# Patient Record
Sex: Female | Born: 2005
Health system: Southern US, Community
[De-identification: ages and names within clinical notes are randomized; demographics above are authoritative.]

## PROBLEM LIST (undated history)

## (undated) ENCOUNTER — Inpatient Hospital Stay (HOSPITAL_COMMUNITY): Payer: Self-pay

## (undated) DIAGNOSIS — Q600 Renal agenesis, unilateral: Secondary | ICD-10-CM

## (undated) DIAGNOSIS — K2100 Gastro-esophageal reflux disease with esophagitis, without bleeding: Secondary | ICD-10-CM

## (undated) DIAGNOSIS — G43D0 Abdominal migraine, not intractable: Secondary | ICD-10-CM

## (undated) DIAGNOSIS — Z789 Other specified health status: Secondary | ICD-10-CM

## (undated) HISTORY — PX: ESOPHAGOGASTRODUODENOSCOPY ENDOSCOPY: SHX5814

---
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
## 2023-01-17 ENCOUNTER — Other Ambulatory Visit: Payer: Self-pay

## 2023-01-17 ENCOUNTER — Encounter (HOSPITAL_COMMUNITY): Payer: Self-pay | Admitting: Emergency Medicine

## 2023-01-17 ENCOUNTER — Emergency Department (HOSPITAL_COMMUNITY)
Admission: EM | Admit: 2023-01-17 | Discharge: 2023-01-18 | Disposition: A | Discharge status: Home / Self Care | Payer: 59 | Attending: Emergency Medicine | Admitting: Emergency Medicine

## 2023-01-17 DIAGNOSIS — R1084 Generalized abdominal pain: Secondary | ICD-10-CM | POA: Diagnosis present

## 2023-01-17 DIAGNOSIS — R112 Nausea with vomiting, unspecified: Secondary | ICD-10-CM | POA: Diagnosis not present

## 2023-01-17 DIAGNOSIS — R109 Unspecified abdominal pain: Secondary | ICD-10-CM

## 2023-01-17 LAB — CBC
HCT: 40.9 % (ref 36.0–46.0)
Hemoglobin: 13.1 g/dL (ref 12.0–15.0)
MCH: 27.7 pg (ref 26.0–34.0)
MCHC: 32 g/dL (ref 30.0–36.0)
MCV: 86.5 fL (ref 80.0–100.0)
Platelets: 425 10*3/uL — ABNORMAL HIGH (ref 150–400)
RBC: 4.73 MIL/uL (ref 3.87–5.11)
RDW: 14.2 % (ref 11.5–15.5)
WBC: 16.6 10*3/uL — ABNORMAL HIGH (ref 4.0–10.5)
nRBC: 0 % (ref 0.0–0.2)

## 2023-01-17 LAB — COMPREHENSIVE METABOLIC PANEL
ALT: 15 U/L (ref 0–44)
AST: 23 U/L (ref 15–41)
Albumin: 4.2 g/dL (ref 3.5–5.0)
Alkaline Phosphatase: 70 U/L (ref 38–126)
Anion gap: 11 (ref 5–15)
BUN: 6 mg/dL (ref 6–20)
CO2: 22 mmol/L (ref 22–32)
Calcium: 9.3 mg/dL (ref 8.9–10.3)
Chloride: 106 mmol/L (ref 98–111)
Creatinine, Ser: 0.85 mg/dL (ref 0.44–1.00)
GFR, Estimated: >60 mL/min (ref >60)
Glucose, Bld: 130 mg/dL — ABNORMAL HIGH (ref 70–99)
Potassium: 3.1 mmol/L — ABNORMAL LOW (ref 3.5–5.1)
Sodium: 139 mmol/L (ref 135–145)
Total Bilirubin: 0.5 mg/dL (ref 0.3–1.2)
Total Protein: 7.6 g/dL (ref 6.5–8.1)

## 2023-01-17 LAB — URINALYSIS, ROUTINE W REFLEX MICROSCOPIC
Bacteria, UA: NONE SEEN
Bilirubin Urine: NEGATIVE
Glucose, UA: NEGATIVE mg/dL
Ketones, ur: NEGATIVE mg/dL
Leukocytes,Ua: NEGATIVE
Nitrite: NEGATIVE
Protein, ur: 30 mg/dL — AB
Specific Gravity, Urine: 1.021 (ref 1.005–1.030)
pH: 5 (ref 5.0–8.0)

## 2023-01-17 LAB — LIPASE, BLOOD: Lipase: 26 U/L (ref 11–51)

## 2023-01-17 LAB — I-STAT BETA HCG BLOOD, ED (MC, WL, AP ONLY): I-stat hCG, quantitative: <5 m[IU]/mL (ref <5)

## 2023-01-17 MED ORDER — ONDANSETRON HCL 4 MG/2ML IJ SOLN
4.0000 mg | Freq: Once | INTRAMUSCULAR | Status: AC
  Administered 2023-01-17: 4 mg via INTRAVENOUS
  Filled 2023-01-17: qty 2

## 2023-01-17 NOTE — ED Triage Notes (Signed)
Patient reports she started her menstrual cycle today and started having severe abd pain along with n/v and bilateral leg weakness and pain.

## 2023-01-18 ENCOUNTER — Emergency Department (HOSPITAL_COMMUNITY): Payer: 59

## 2023-01-18 LAB — CK: Total CK: 118 U/L (ref 38–234)

## 2023-01-18 MED ORDER — ONDANSETRON HCL 4 MG/2ML IJ SOLN
4.0000 mg | Freq: Once | INTRAMUSCULAR | Status: AC
  Administered 2023-01-18: 4 mg via INTRAVENOUS
  Filled 2023-01-18: qty 2

## 2023-01-18 MED ORDER — GADOBUTROL 1 MMOL/ML IV SOLN
5.5000 mL | Freq: Once | INTRAVENOUS | Status: AC | PRN
  Administered 2023-01-18: 5.5 mL via INTRAVENOUS

## 2023-01-18 MED ORDER — LACTATED RINGERS IV BOLUS
1000.0000 mL | Freq: Once | INTRAVENOUS | Status: AC
  Administered 2023-01-18: 1000 mL via INTRAVENOUS

## 2023-01-18 MED ORDER — FENTANYL CITRATE PF 50 MCG/ML IJ SOSY
50.0000 ug | PREFILLED_SYRINGE | Freq: Once | INTRAMUSCULAR | Status: AC
  Administered 2023-01-18: 50 ug via INTRAVENOUS
  Filled 2023-01-18: qty 1

## 2023-01-18 MED ORDER — PROCHLORPERAZINE EDISYLATE 10 MG/2ML IJ SOLN
10.0000 mg | Freq: Once | INTRAMUSCULAR | Status: AC
  Administered 2023-01-18: 10 mg via INTRAVENOUS
  Filled 2023-01-18: qty 2

## 2023-01-18 MED ORDER — LORAZEPAM 1 MG PO TABS
1.0000 mg | ORAL_TABLET | Freq: Once | ORAL | Status: AC
  Administered 2023-01-18: 1 mg via ORAL
  Filled 2023-01-18: qty 1

## 2023-01-18 MED ORDER — POTASSIUM CHLORIDE CRYS ER 20 MEQ PO TBCR
40.0000 meq | EXTENDED_RELEASE_TABLET | Freq: Once | ORAL | Status: AC
  Administered 2023-01-18: 40 meq via ORAL
  Filled 2023-01-18: qty 2

## 2023-01-18 NOTE — ED Notes (Signed)
Patient was given K+ pills x 2 upon return from MRI and did well with them and water.

## 2023-01-18 NOTE — Discharge Instructions (Addendum)
Follow up with a neurologist for further evaluation of your leg numbness and weakness.  Your MRI today did not show any spinal cord problems.  Follow-up with your primary doctor regarding your menstrual cramps and abdominal pain.  Return to the ED with worsening pain, fever, vomiting, chest pain, shortness of breath or any concerns.

## 2023-01-18 NOTE — ED Provider Notes (Signed)
Julie Rubio   CSN: [id number] Arrival date & time: 01/17/23  1902     History  Chief Complaint  Patient presents with   Abdominal Pain   Extremity Weakness    Julie Rubio is a 17 y.o. female.  Patient with a history of abdominal migraines presenting with typical lower abdominal pain and nausea and vomiting that she gets with her menses.  Menses that onset today.  She has diffuse crampy lower abdominal pain with multiple episodes of nonbloody nonbilious emesis.  This is similar to previous abdominal migraines.  However today she developed "numbness and tingling" to her bilateral quadriceps from the thighs to the knees bilaterally.  Also feels weak in her legs.  States she was able to walk but is not able to.  Denies any trauma.  Denies any back pain or neck pain.  Denies any fevers, chills, chest pain or shortness of breath. No head trauma bowel or bladder incontinence.  No history of IV drug abuse or cancer.  No neck or back surgery.  Mother reports patient had similar episode in 2019 when she had numbness and tingling to her legs bilaterally was admitted to the hospital.  She had negative MRIs at that time and was thought she might have some postviral myalgia.  She was not given a clear diagnosis and did not follow-up.  She denies any chest pain, shortness of breath, pain with urination or blood in the urine.   No back pain or neck pain today.  Was not given a clear diagnosis when this episode happened in 2019.  Symptoms did improve and she had negative MRIs.  The history is provided by the patient and a relative.  Abdominal Pain Associated symptoms: nausea   Associated symptoms: no cough, no dysuria, no fever, no hematuria and no shortness of breath   Extremity Weakness Associated symptoms include abdominal pain. Pertinent negatives include no shortness of breath.       Home Medications Prior to Admission  medications   Medication Sig Start Date End Date Taking? Authorizing Provider  cetirizine HCl (ZYRTEC) 5 MG/5ML SYRP Take 7.5 mLs (7.5 mg total) by mouth daily. Patient not taking: Reported on 02/09/2018 09/30/13   Ree Shay, MD  cyclobenzaprine (FLEXERIL) 5 MG tablet Take 1 tablet (5 mg total) by mouth 3 (three) times daily as needed. 01/19/21   Viviano Simas, NP  cyproheptadine (PERIACTIN) 4 MG tablet Take 4 mg by mouth 2 (two) times daily. 12/05/17   [provider]  fluticasone (FLONASE) 50 MCG/ACT nasal spray Place 1 spray into both nostrils daily as needed for allergies. 12/14/17   [provider]  omeprazole (PRILOSEC) 20 MG capsule Take 20 mg by mouth daily. 01/10/18   [provider]      Allergies    Nsaids    Review of Systems   Review of Systems  Constitutional:  Negative for activity change, appetite change and fever.  HENT:  Negative for congestion and rhinorrhea.   Respiratory:  Negative for cough, chest tightness and shortness of breath.   Gastrointestinal:  Positive for abdominal pain and nausea.  Genitourinary:  Negative for dysuria and hematuria.  Musculoskeletal:  Positive for extremity weakness. Negative for arthralgias and myalgias.  Skin:  Negative for rash.  Neurological:  Positive for numbness. Negative for weakness.   all other systems are negative except as noted in the HPI and PMH.    Physical Exam Updated Vital Signs  BP 119/75   Pulse (!) 101   Temp 98 F (36.7 C)   Resp 14   Ht 5\' 4"  (1.626 m)   Wt 54.9 kg   LMP 01/17/2023 (Exact Date)   SpO2 100%   BMI 20.77 kg/m  Physical Exam Vitals and nursing Rubio reviewed.  Constitutional:      General: She is not in acute distress.    Appearance: She is well-developed.  HENT:     Head: Normocephalic and atraumatic.     Mouth/Throat:     Pharynx: No oropharyngeal exudate.  Eyes:     Conjunctiva/sclera: Conjunctivae normal.     Pupils: Pupils are equal, round, and reactive to  light.  Neck:     Comments: No meningismus. Cardiovascular:     Rate and Rhythm: Normal rate and regular rhythm.     Heart sounds: Normal heart sounds. No murmur heard. Pulmonary:     Effort: Pulmonary effort is normal. No respiratory distress.     Breath sounds: Normal breath sounds.  Abdominal:     Palpations: Abdomen is soft.     Tenderness: There is abdominal tenderness. There is no guarding or rebound.     Comments: Diffuse abdominal tenderness, no guarding or rebound  Musculoskeletal:        General: No tenderness. Normal range of motion.     Cervical back: Normal range of motion and neck supple.     Comments: 5/5 strength in bilateral lower extremities. Ankle plantar and dorsiflexion intact. Great toe extension intact bilaterally. +2 DP and PT pulses. +2 patellar reflexes bilaterally. Normal gait.  Subjective paresthesias to bilateral anterior thighs.  Skin:    General: Skin is warm.  Neurological:     Mental Status: She is alert and oriented to person, place, and time.     Cranial Nerves: No cranial nerve deficit.     Motor: No abnormal muscle tone.     Coordination: Coordination normal.     Comments: No ataxia on finger to nose bilaterally. No pronator drift. 5/5 strength throughout. CN 2-12 intact.Equal grip strength. Sensation intact.   Psychiatric:        Behavior: Behavior normal.     ED Results / Procedures / Treatments   Labs (all labs ordered are listed, but only abnormal results are displayed) Labs Reviewed  COMPREHENSIVE METABOLIC PANEL - Abnormal; Notable for the following components:      Result Value   Potassium 3.1 (*)    Glucose, Bld 130 (*)    All other components within normal limits  CBC - Abnormal; Notable for the following components:   WBC 16.6 (*)    Platelets 425 (*)    All other components within normal limits  URINALYSIS, ROUTINE W REFLEX MICROSCOPIC - Abnormal; Notable for the following components:   Hgb urine dipstick MODERATE (*)     Protein, ur 30 (*)    All other components within normal limits  LIPASE, BLOOD  CK  I-STAT BETA HCG BLOOD, ED ([facility], [facility], [facility] ONLY)    EKG EKG Interpretation  Date/Time:  Wednesday Jan 18 2023 04:53:05 EDT Ventricular Rate:  97 PR Interval:  117 QRS Duration: 88 QT Interval:  343 QTC Calculation: 436 R Axis:   88 Text Interpretation: Sinus rhythm Borderline short PR interval No previous ECGs available Confirmed by Glynn Octave [phone]) on 01/18/2023 4:58:12 AM  Radiology MR Lumbar Spine W Wo Contrast  Result Date: 01/18/2023 CLINICAL DATA:  Initial evaluation for mid back pain with bilateral  leg weakness and pain. EXAM: MRI THORACIC AND LUMBAR SPINE WITHOUT AND WITH CONTRAST TECHNIQUE: Multiplanar and multiecho pulse sequences of the thoracic and lumbar spine were obtained without and with intravenous contrast. CONTRAST:  5.69mL GADAVIST GADOBUTROL 1 MMOL/ML IV SOLN COMPARISON:  Prior study from 02/10/2018. FINDINGS: MRI THORACIC SPINE FINDINGS Alignment: Vertebral bodies normally aligned with preservation of the normal thoracic kyphosis. No listhesis. Vertebrae: Vertebral body height maintained without acute or chronic fracture. Bone marrow signal intensity within normal limits. No discrete or worrisome osseous lesions. No abnormal marrow edema or enhancement. Cord:  Normal signal and morphology.  No abnormal enhancement. Paraspinal and other soft tissues: Paraspinous soft tissues within normal limits. Congenital absence of the right kidney with compensatory hypertrophy of the left kidney noted. Disc levels: No significant disc pathology seen within the thoracic spine. Intervertebral discs are well hydrated with preserved disc height. No disc bulge or focal disc herniation. No significant facet disease. No canal or neural foraminal stenosis or evidence for neural impingement. MRI LUMBAR SPINE FINDINGS Segmentation: Standard. Lowest well-formed disc space labeled the L5-S1 level. Alignment: Trace  dextroscoliosis. Alignment otherwise normal preservation of the normal lumbar lordosis. No listhesis. Vertebrae: Vertebral body height maintained without acute or chronic fracture. Bone marrow signal intensity within normal limits. No discrete or worrisome osseous lesions. No abnormal marrow edema or enhancement. Conus medullaris: Extends to the T12-L1 level and appears normal. No abnormal enhancement. Paraspinal and other soft tissues: Paraspinous soft tissues within normal limits. Congenital absence of the right kidney with compensatory hypertrophy of the left kidney noted. Disc levels: No significant disc pathology seen within the lumbar spine. Intervertebral discs are well hydrated with preserved disc height. No disc bulge or focal disc herniation. No significant facet disease. No canal or neural foraminal stenosis or evidence for neural impingement. IMPRESSION: 1. Normal MRI of the thoracic and lumbar spine and spinal cord. No acute abnormality. No significant disc pathology, stenosis, or evidence for neural impingement. 2. Congenital absence of the right kidney with compensatory hypertrophy of the left kidney. Electronically Signed   By: Rise Mu M.D.   On: 01/18/2023 03:16   MR THORACIC SPINE W WO CONTRAST  Result Date: 01/18/2023 CLINICAL DATA:  Initial evaluation for mid back pain with bilateral leg weakness and pain. EXAM: MRI THORACIC AND LUMBAR SPINE WITHOUT AND WITH CONTRAST TECHNIQUE: Multiplanar and multiecho pulse sequences of the thoracic and lumbar spine were obtained without and with intravenous contrast. CONTRAST:  5.58mL GADAVIST GADOBUTROL 1 MMOL/ML IV SOLN COMPARISON:  Prior study from 02/10/2018. FINDINGS: MRI THORACIC SPINE FINDINGS Alignment: Vertebral bodies normally aligned with preservation of the normal thoracic kyphosis. No listhesis. Vertebrae: Vertebral body height maintained without acute or chronic fracture. Bone marrow signal intensity within normal limits. No  discrete or worrisome osseous lesions. No abnormal marrow edema or enhancement. Cord:  Normal signal and morphology.  No abnormal enhancement. Paraspinal and other soft tissues: Paraspinous soft tissues within normal limits. Congenital absence of the right kidney with compensatory hypertrophy of the left kidney noted. Disc levels: No significant disc pathology seen within the thoracic spine. Intervertebral discs are well hydrated with preserved disc height. No disc bulge or focal disc herniation. No significant facet disease. No canal or neural foraminal stenosis or evidence for neural impingement. MRI LUMBAR SPINE FINDINGS Segmentation: Standard. Lowest well-formed disc space labeled the L5-S1 level. Alignment: Trace dextroscoliosis. Alignment otherwise normal preservation of the normal lumbar lordosis. No listhesis. Vertebrae: Vertebral body height maintained without acute or chronic fracture.  Bone marrow signal intensity within normal limits. No discrete or worrisome osseous lesions. No abnormal marrow edema or enhancement. Conus medullaris: Extends to the T12-L1 level and appears normal. No abnormal enhancement. Paraspinal and other soft tissues: Paraspinous soft tissues within normal limits. Congenital absence of the right kidney with compensatory hypertrophy of the left kidney noted. Disc levels: No significant disc pathology seen within the lumbar spine. Intervertebral discs are well hydrated with preserved disc height. No disc bulge or focal disc herniation. No significant facet disease. No canal or neural foraminal stenosis or evidence for neural impingement. IMPRESSION: 1. Normal MRI of the thoracic and lumbar spine and spinal cord. No acute abnormality. No significant disc pathology, stenosis, or evidence for neural impingement. 2. Congenital absence of the right kidney with compensatory hypertrophy of the left kidney. Electronically Signed   By: Rise Mu M.D.   On: 01/18/2023 03:16   CT  ABDOMEN PELVIS WO CONTRAST  Result Date: 01/18/2023 CLINICAL DATA:  Severe abdominal pain, reportedly nonlocalized. Also with bilateral leg weakness. Epic notes indicate congenital absence of right kidney. EXAM: CT ABDOMEN AND PELVIS WITHOUT CONTRAST TECHNIQUE: Multidetector CT imaging of the abdomen and pelvis was performed following the standard protocol without IV contrast. RADIATION DOSE REDUCTION: This exam was performed according to the departmental dose-optimization program which includes automated exposure control, adjustment of the mA and/or kV according to patient size and/or use of iterative reconstruction technique. COMPARISON:  None Available. FINDINGS: Lower chest: No abnormality. Hepatobiliary: The unenhanced liver is homogeneous. No focal abnormality is seen without contrast. The gallbladder bile ducts are unremarkable. Pancreas: Unremarkable without contrast. Spleen: Unremarkable without contrast. No splenomegaly. There is a small splenule along the inferolateral edge of the spleen. Adrenals/Urinary Tract: Congenitally absent right kidney with compensatorily prominent left kidney. There is no adrenal mass. No contour deforming renal cortical abnormality on the left. There are occasional punctate nonobstructive caliceal stones in the collecting system. There is no hydroureteronephrosis or ureteral stone. The bladder is unremarkable allowing for the degree of distention. Stomach/Bowel: The unopacified stomach and small bowel are unremarkable. The appendix is segmentally visible over portions, with portions obscured due to overlapping structures. The visualized portion of the appendix is normal in caliber. There is mild wall thickening versus underdistention in the ascending, transverse and descending colon. Findings consistent with nondistention versus colitis. The rectosigmoid segment is stool-filled and otherwise unremarkable. Vascular/Lymphatic: No significant vascular findings are present. No  enlarged abdominal or pelvic lymph nodes. Reproductive: Uterus and bilateral adnexa are unremarkable. Other: No abdominal wall hernia or abnormality. No abdominopelvic ascites. Musculoskeletal: Slight lumbar dextroscoliosis. No acute or significant osseous findings. IMPRESSION: 1. Congenitally absent right kidney with compensatorily prominent left kidney. 2. Nonobstructive left micronephrolithiasis. 3. Mild wall thickening versus underdistention in the ascending, transverse and descending colon. Findings consistent with nondistention versus colitis. 4. Slight lumbar dextroscoliosis. Electronically Signed   By: Almira Bar M.D.   On: 01/18/2023 01:50    Procedures Procedures    Medications Ordered in ED Medications  prochlorperazine (COMPAZINE) injection 10 mg (has no administration in time range)  ondansetron (ZOFRAN) injection 4 mg (4 mg Intravenous Given 01/17/23 1949)  lactated ringers bolus 1,000 mL (1,000 mLs Intravenous New Bag/Given 01/18/23 0042)  ondansetron (ZOFRAN) injection 4 mg (4 mg Intravenous Given 01/18/23 0043)  fentaNYL (SUBLIMAZE) injection 50 mcg (50 mcg Intravenous Given 01/18/23 0043)    ED Course/ Medical Decision Making/ A&P  Medical Decision Making Amount and/or Complexity of Data Reviewed Labs: ordered. Decision-making details documented in ED Course. Radiology: ordered and independent interpretation performed. Decision-making details documented in ED Course. ECG/medicine tests: ordered and independent interpretation performed. Decision-making details documented in ED Course.  Risk Prescription drug management.   Abdominal pain with nausea and vomiting the setting of menses similar to previous abdominal migraines.  Vitals are stable, no distress.  Abdomen is tender but not peritoneal. Does have pain and "numbness" to bilateral thighs which is unusual for her.   Discussed with Dr. Otelia Limes of neurology.  He does not favor spinal cord  pathology.  Recommends treatment for abdominal migraine with fluids and antiemetics.  She has intact distal strength, sensation, pulses and reflexes.  Low suspicion for Gullian Barre syndrome or transverse myelitis.  HCG is negative.  Urinalysis is negative.  Potassium slightly low at 3.1, this is replaced.  Leukocytosis similar to previous. CK is normal. Abdomen soft without peritoneal signs.''  Patient treated empirically for suspected abdominal migraine causing her cramps and numbness to her anterior thighs.  Low suspicion for acute spinal cord pathology.  Low suspicion for Gullian Barre syndrome or transverse myelitis.  MRI shows no acute spinal cord pathology.  Results reviewed interpreted by me.  Abdomen is soft on recheck without peritoneal signs.  Low suspicion of acute surgical pathology.  Patient sleepy after receiving ativan for MRI. But she is able to tolerate PO and ambulate without assistance.   Suspect her numbness and pain to legs was secondary to her abdominal pain and possible abdominal migraine.  No evidence of acute neurological emergency.   Referral to neurology given.  Patient feels improved, tolerating PO and ambulatory.  Results d/w patient and mother at bedside who agree with outpatient followup.  Return precautions discussed.         Final Clinical Impression(s) / ED Diagnoses Final diagnoses:  Abdominal pain, unspecified abdominal location    Rx / DC Orders ED Discharge Orders     None         Inez Stantz, Jeannett Senior, MD 01/18/23 [id number]

## 2023-01-19 ENCOUNTER — Encounter: Payer: Self-pay | Admitting: Neurology

## 2023-01-31 ENCOUNTER — Ambulatory Visit: Payer: 59 | Admitting: Neurology

## 2023-01-31 ENCOUNTER — Encounter: Payer: Self-pay | Admitting: Neurology

## 2023-01-31 VITALS — BP 120/71 | HR 88 | Ht 64.0 in | Wt 120.0 lb

## 2023-01-31 DIAGNOSIS — R531 Weakness: Secondary | ICD-10-CM | POA: Diagnosis not present

## 2023-01-31 DIAGNOSIS — R202 Paresthesia of skin: Secondary | ICD-10-CM | POA: Diagnosis not present

## 2023-01-31 NOTE — Patient Instructions (Signed)
If your symptoms return, please contact my office for nerve and muscle testing

## 2023-01-31 NOTE — Progress Notes (Signed)
[facility] Clinic Note - Initial Visit   Date: 01/31/2023   Julie Rubio MRN: [id number] DOB: 12/23/2005   Dear Dr. Manus Gunning:  Thank you for your kind referral of Julie Rubio for consultation of bilateral leg weakness. Although her history is well known to you, please allow Julie Rubio to reiterate it for the purpose of our medical record. The patient was accompanied to the clinic by self.     Shalini Tumeka Aseltine is a 17 y.o. right-handed female with abdominal migraines presenting for evaluation of bilateral leg weakness.   IMPRESSION/PLAN: Paresthesias and subjective weakness.  Neurological exam is entirely normal today.  MRI thoracic and lumbar spine was personally viewed and does not show any abnormalities.  Her potassium was mildly low when she presented to the ER, however potassium when she has similar presentation in 2019 was normal, therefore hypokalemic periodic paralysis is unlikely.  NCS/EMG of the legs was offered, she declined additional testing and will monitor symptoms.  Given lack of organic etiology and diffuse paralysis which self-resolved, there was concern whether symptoms were related to migraine.  Also, conversion disorder cannot be excluded.   Return to clinic as needed  ------------------------------------------------------------- History of present illness: She presented to the ER on 5/14 after having sudden onset of bilateral leg numbness and weakness.  She reports that weakness was so profound that she was unable to support herself to walk and her uncle carried into the car to go to the ER.  ER notes indicate she was also having abdominal pain and nausea similar to what she has experienced with abdominal migraines. She had MRI thoracic and lumbar spine which was normal. ER notes indicate that she was able to ambulate without assistance and discharged home, however patient tells me that she was unable to walk until the following day.  She  reports having a similar spell in 2019 which was thought to be associated with viral illness (MRI wwo contrast cervical, thoracic, and lumbar spine was normal).  Symptoms also self-resolved.   Out-side paper records, electronic medical record, and images have been reviewed where available and summarized as:  MRI thoracic and lumbar spine wwo contrast 01/18/2023: 1. Normal MRI of the thoracic and lumbar spine and spinal cord. No acute abnormality. No significant disc pathology, stenosis, or evidence for neural impingement. 2. Congenital absence of the right kidney with compensatory hypertrophy of the left kidney.  Lab Results  Component Value Date   ESRSEDRATE 10 02/09/2018   Lab Results  Component Value Date   CREATININE 0.85 01/17/2023   BUN 6 01/17/2023   NA 139 01/17/2023   K 3.1 (L) 01/17/2023   CL 106 01/17/2023   CO2 22 01/17/2023     Past Medical History:  Diagnosis Date   Abdominal migraine    Medical history non-contributory    Reflux esophagitis     Past Surgical History:  Procedure Laterality Date   ESOPHAGOGASTRODUODENOSCOPY ENDOSCOPY       Medications:  Outpatient Encounter Medications as of 01/31/2023  Medication Sig   cyclobenzaprine (FLEXERIL) 5 MG tablet Take 1 tablet (5 mg total) by mouth 3 (three) times daily as needed.   cyproheptadine (PERIACTIN) 4 MG tablet Take 4 mg by mouth 2 (two) times daily.   dicyclomine (BENTYL) 20 MG tablet Take 20 mg by mouth 3 (three) times daily.   fluticasone (FLONASE) 50 MCG/ACT nasal spray Place 1 spray into both nostrils daily as needed for allergies.   ondansetron (ZOFRAN) 4 MG  tablet 1 dissolving tablet Orally three times daily   cetirizine HCl (ZYRTEC) 5 MG/5ML SYRP Take 7.5 mLs (7.5 mg total) by mouth daily. (Patient not taking: Reported on 02/09/2018)   omeprazole (PRILOSEC) 20 MG capsule Take 20 mg by mouth daily. (Patient not taking: Reported on 01/31/2023)   No facility-administered encounter medications on file  as of 01/31/2023.    Allergies:  Allergies  Allergen Reactions   Nsaids     Pt has one kidney    Family History: Family History  Problem Relation Age of Onset   Raynaud syndrome Mother     Social History: Social History   Tobacco Use   Smoking status: Never   Smokeless tobacco: Never  Vaping Use   Vaping Use: Never used  Substance Use Topics   Alcohol use: Never   Drug use: Never   Social History   Social History Narrative   Lives with her mother at home, no pets, no smoking.      Right Handed    Lives in a one story home      College Student- Dance    Vital Signs:  BP 120/71   Pulse 88   Ht 5\' 4"  (1.626 m)   Wt 120 lb (54.4 kg)   LMP 01/17/2023 (Exact Date)   BMI 20.60 kg/m    Neurological Exam: MENTAL STATUS including orientation to time, place, person, recent and remote memory, attention span and concentration, language, and fund of knowledge is normal.  Speech is not dysarthric.  CRANIAL NERVES: II:  No visual field defects.     III-IV-VI: Pupils equal round and reactive to light.  Normal conjugate, extra-ocular eye movements in all directions of gaze.  No nystagmus.  No ptosis.   V:  Normal facial sensation.    VII:  Normal facial symmetry and movements.   VIII:  Normal hearing and vestibular function.   IX-X:  Normal palatal movement.   XI:  Normal shoulder shrug and head rotation.   XII:  Normal tongue strength and range of motion, no deviation or fasciculation.  MOTOR:  No atrophy, fasciculations or abnormal movements.  No pronator drift.   Upper Extremity:  Right  Left  Deltoid  5/5   5/5   Biceps  5/5   5/5   Triceps  5/5   5/5   Wrist extensors  5/5   5/5   Wrist flexors  5/5   5/5   Finger extensors  5/5   5/5   Finger flexors  5/5   5/5   Dorsal interossei  5/5   5/5   Abductor pollicis  5/5   5/5   Tone (Ashworth scale)  0  0   Lower Extremity:  Right  Left  Hip flexors  5/5   5/5   Knee flexors  5/5   5/5   Knee extensors   5/5   5/5   Dorsiflexors  5/5   5/5   Plantarflexors  5/5   5/5   Toe extensors  5/5   5/5   Toe flexors  5/5   5/5   Tone (Ashworth scale)  0  0   MSRs:                                           Right        Left brachioradialis 2+  2+  biceps 2+  2+  triceps 2+  2+  patellar 2+  2+  ankle jerk 2+  2+  Hoffman no  no  plantar response down  down   SENSORY:  Normal and symmetric perception of light touch, pinprick, vibration, and temperature.    COORDINATION/GAIT: Normal finger-to- nose-finger.  Intact rapid alternating movements bilaterally.  Able to rise from a chair without using arms.  Gait narrow based and stable. Tandem and stressed gait intact.    Thank you for allowing me to participate in patient's care.  If I can answer any additional questions, I would be pleased to do so.    Sincerely,    Bingham Millette K. Allena Katz, DO

## 2023-02-07 ENCOUNTER — Encounter: Payer: Self-pay | Admitting: Psychology

## 2023-02-07 ENCOUNTER — Encounter: Payer: 59 | Attending: Psychology | Admitting: Psychology

## 2023-02-07 DIAGNOSIS — F410 Panic disorder [episodic paroxysmal anxiety] without agoraphobia: Secondary | ICD-10-CM

## 2023-02-07 DIAGNOSIS — F411 Generalized anxiety disorder: Secondary | ICD-10-CM | POA: Insufficient documentation

## 2023-02-07 DIAGNOSIS — F331 Major depressive disorder, recurrent, moderate: Secondary | ICD-10-CM | POA: Diagnosis present

## 2023-02-07 DIAGNOSIS — G43D0 Abdominal migraine, not intractable: Secondary | ICD-10-CM | POA: Diagnosis present

## 2023-02-07 DIAGNOSIS — F5102 Adjustment insomnia: Secondary | ICD-10-CM | POA: Diagnosis present

## 2023-02-07 NOTE — Progress Notes (Signed)
Neuropsychological Consultation   Patient:   Julie Rubio   DOB:   02/21/2006  MR Number:  [id number]  Location:  [facility] MEDICINE [facility] PHYSICAL MEDICINE & REHABILITATION [address] [place], [address] [phone] [facility] [place] [address] Dept: [phone]           Date of Service:   02/07/2023  Location of Service and Individuals present: Today's visit was conducted in my outpatient clinic office with the patient present.  Start Time:   9 AM End Time:   10:30 AM  Patient Consent and Confidentiality: Review of limits of confidentiality including the fact that I had previously seen her mother for unrelated issue and both the patient and her mother consent to the patient seeing me and understand some of the vulnerabilities but they are quite open with each other regarding what we will be addressing.  The patient is an adult and will be provided with a confidential interaction and mother is understanding of restriction of information being provided to her regarding the patient and her care.  Consent for Evaluation and Treatment:  Signed:  Yes Explanation of Privacy Policies:  Signed:  Yes Discussion of Confidentiality Limits:  Yes  Provider/Observer:  Arley Phenix, Psy.D.       Clinical Neuropsychologist       Billing Code/Service: [phone]  Chief Complaint:     Chief Complaint  Patient presents with   Anxiety   Depression   Migraine   Other    Abdominal migraine   Panic Attack   Sleeping Problem    Reason for Service:    Julie Rubio is a 17 year old female referred by her primary care provider for neuropsychological/psychological therapeutic interventions.  Patient has also been followed by Nita Sickle, DO with [facility] neurology due to past paresthesias with subjective leg weakness, history of migraine and more frequently abdominal migraine.  During the May 2024 event the patient was unable to stand or walk and  experienced significant abdominal pain and severe cramps.  Other medical issues include the patient having a solitary kidney without kidney disease and that kidney, episodes of anemia.  Patient has a history of depressive symptomatology going back to around the eighth grade and anxiety starting in high school.  The patient has had episodes of severe depression with suicidal ideation previously and currently denies any suicidal ideation with improvement in depression but continued psychosocial stressors that can exacerbate anxiety and depressive symptomatology.  The patient's most recent acute event where she developed numbness and tingling in bilateral quadriceps from the thighs to the knees and inability to walk resulted in emergency department presentation on 01/17/2023.  Patient has had a past history of abdominal migraines including abdominal pain and nausea and vomiting that typically correlate with the onset of her menses.  She did return to baseline within 24 hours and workup did not indicate acute medical issues such as Katheran Awe, stenosis or abnormalities in her spinal column/spinal cord and no clear diagnosis beyond the potential for it to be related to her abdominal migraines and blood flow changes.  The patient has not had another episode of this since May of this year.  Patient reports that she developed significant depression in middle school after her father, who was separated from her mother, went back to school for a professional truck driving program.  She did not see him much and they grew distance during this time.  Then the person that the patient had  medical illness in 2019 that resulted in hospitalization and some complications associated with antibiotic interventions complicated relationship she would been in for 3 years and the young man ultimately cheated on her resulting in a break-up of this relationship.  This was in the eighth/just prior to ninth grade.  The patient then started  high school and was relatively apprehensive about making new relationships after the distance/loss from her father and experience with her now ex-boyfriend.  The patient reports that then the COVID pandemic developed and there was a lot of stress and isolation without going to school and doing things online.  The patient reports that her depression became more significant during that time and she had difficulty doing her schoolwork and was overwhelmed with the isolation.  Patient reports that she was even surprised she was able to graduate as there were significant epochs of time during COVID where she was doing little of her schoolwork.  She developed significant depression with suicidal ideation during that time.  With the patient's father returned to this area and reconnected and had a positive time with their relationship.  The patient was active in school and track after the ending of pandemic restrictions.  The patient's father and had significant health issues.  The patient went on the track meet where she likely contacted COVID and when she returned she was fearful that she would give her father COVID and when he was not doing well medically she avoided going to see him.  During this time her father had medical crisis and ultimately died.  The funeral was very difficult for the patient with her father having 2 younger children from another mother and the fathers family essentially distancing themselves from her after the father passed away.  The patient reports that she still has recurrent dreams about him and wakes up feeling like he is there and will be in a panic when she wakes up.  The patient describes other anxiety/panic events generalized anxiety at other times.  The patient is not taking any SSRI or other psychotropic medications.  The patient has significant sleep disturbance and currently primarily sleeps during the day and is awake at night.  Patient has difficulty falling asleep and staying  asleep and this has been a chronic long-term issue.  Patient has had vivid dreams that are nightmare in nature and waking up in distress state.  The patient describes a irregular appetite and inconsistent eating pattern although she does eat high-quality foods the majority of the time when she does eat.  The patient gets regular exercise and was on a track scholarship when she started school at [employer] and while she does not plan on pursuing track now that she is transferring to [employer] she does plan on majoring in dance and continuing with her regular physical activity.  The patient had a very stressful experience starting college at [employer].  She had a very challenging and troubled roommate assigned to her initially.  Patient also had gone to [employer] on a track scholarship and after agreeing the coach was changed and there was a lot of complications around track with failures of documentation by the track coach around eligibility not only with the patient but open the other track participants.  The roommate was very stressful and the administrative end of the school did not handle the situation particularly well adding to the patient's stress.  She is not returning back to [employer] primarily because of challenges  with how they provided housing and their response to many factors related to campus life for the patient.  Medical History:   Past Medical History:  Diagnosis Date   Abdominal migraine    Medical history non-contributory    Reflux esophagitis          Patient Active Problem List   Diagnosis Date Noted   Fever, unspecified    Kidney congenitally absent, right 02/10/2018   Bilateral leg pain 02/09/2018   Leg weakness, bilateral 02/09/2018   Abdominal migraine 12/11/2017   Gastroesophageal reflux disease with esophagitis 12/11/2017    Additional Tests and Measures from other records:  Neuroimaging Results: Patient has  had 2 episodes of acute leg weakness to the point of inability to ambulate.  During the 2019 and 2024 events she had MR thoracic, cervical spine conducted as well as CT abdomen during the 2024 event.  There have been no abnormalities noted on these MRIs to explain her acute onset of leg weakness and inability to ambulate.  Laboratory Tests: Patient has had episodes of elevated white blood cell count which was one of the reasons she was treated with antibiotics in 2019.  She has had anemia at times but not severe and low hemoglobin in the past.  She has also had elevated platelet levels but not severe.  Patient was mildly low in potassium with her recent hospitalization/ED visit.  Current Typical Mood State:  Depressed and Anxious  Sleep: Patient has a very poor sleep hygiene pattern and difficulty falling asleep and staying asleep.  Behavioral Observation/Mental Status:   Kazlynn Hibberd  presents as a 17 y.o.-year-old Right handed African American Female who appeared her stated age. her dress was Appropriate and she was Well Groomed and her manners were Appropriate to the situation.  her participation was indicative of Appropriate and Attentive behaviors.  There were not physical disabilities noted.  she displayed an appropriate level of cooperation and motivation.    Interactions:    Active Appropriate and Attentive  Attention:   within normal limits and attention span and concentration were age appropriate  Memory:   within normal limits; recent and remote memory intact  Visuo-spatial:   not examined  Speech (Volume):  low  Speech:   normal; normal  Thought Process:  Coherent and Relevant  Coherent, Directed, Logical, and Organized  Though Content:  WNL; not suicidal and not homicidal  Orientation:   person, place, time/date, and situation  Judgment:   Good  Planning:   Fair  Affect:    Anxious  Mood:    Dysphoric  Insight:   Good  Intelligence:   high  Marital  Status/Living:  Patient was born and raised in [place] and has 2 younger half siblings.  She lives with her mother and is her mother's only child.  Her parents are divorced and her father is now deceased.  The patient is lived with her mother her entire life with the exception of time when she was living in the dorms at [employer].  The patient is not married and has no children.  She is transferring to [employer] in [place].  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  Patient has had active participation in extracurricular activities and has participated in dance, track and field and the cheer team at [employer].  Patient does not expect  to be part of the track team at [employer].  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    Presenter, broadcasting and Interests: Working out/exercise, Psychologist, educational and cooking  Impact of Symptoms on Work or School:  Patient has had significant psychosocial challenges and struggles that if impacted her mood status and her capacity for work and school.  Impact of Symptoms on Social Functioning and Interpersonal Relationships: Patient tends to struggle with developing and maintaining friends and relationships and is apprehensive about getting connected to someone for fear of loss.   Psychiatric History:    Abuse/Trauma History: Patient's father died in the recent past due to significant medical complications.  Previous Diagnoses: Patient has been diagnosed with recurrent major depression and generalized anxiety with panic events.  Patient had significant episode of severe depression with suicidal ideation in 2019/2020.  History of Substance Use or Abuse:  No concerns of substance abuse are reported.    Family  Med/Psych History:  Family History  Problem Relation Age of Onset   Raynaud syndrome Mother     Risk of Suicide/Violence: virtually non-existent patient denies any suicidal or homicidal ideation.  Impression/DX:   Aiyahna Farquhar is a 17 year old female referred by her primary care provider for neuropsychological/psychological therapeutic interventions.  Patient has also been followed by Nita Sickle, DO with [facility] neurology due to past paresthesias with subjective leg weakness, history of migraine and more frequently abdominal migraine.  During the May 2024 event the patient was unable to stand or walk and experienced significant abdominal pain and severe cramps.  Other medical issues include the patient having a solitary kidney without kidney disease and that kidney, episodes of anemia.  Patient has a history of depressive symptomatology going back to around the eighth grade and anxiety starting in high school.  The patient has had episodes of severe depression with suicidal ideation previously and currently denies any suicidal ideation with improvement in depression but continued psychosocial stressors that can exacerbate anxiety and depressive symptomatology.  Disposition/Plan:  We have set the patient up for psychotherapeutic interventions around depression and generalized anxiety disorder and also looking at significant sleep disorder/insomnia and addressing issues related to abdominal migraine/migraine.  Initially we have scheduled for appointments approximately 2 weeks apart to work on foundational aspects of adjustment and addressing issues that are contributing to her recurrent depression and anxiety and sleep disturbance as well as her abdominal migraine history.  Because of scheduling difficulties they were not initially scheduled until April and May 2025 as those were the first available in my schedule but the patient has been placed on the wait list.  We do expect appointments to open up  in late summer early fall follow-up and have sooner appointments with the patient.  Diagnosis:    Major depressive disorder, recurrent episode, moderate (HCC)  Generalized anxiety disorder with panic attacks  Abdominal migraine, not intractable  Adjustment insomnia        Note: This document was prepared using Dragon voice recognition software and may include unintentional dictation errors.   Electronically Signed   _______________________ Arley Phenix, Psy.D. Clinical Neuropsychologist

## 2023-02-21 ENCOUNTER — Ambulatory Visit: Payer: 59 | Admitting: Neurology

## 2023-09-05 IMAGING — MR MR SHOULDER*L* W/O CM
8 series · 40 of 40 positions shown · non-contrast
Comparison: X-ray shoulder 01/19/2021.

CLINICAL DATA: Rotator cuff syndrome of left shoulder. Left
shoulder pain with decreased range of motion for 1 year related to
an injury, 01/19/2021.

EXAM:
MRI OF THE LEFT SHOULDER WITHOUT CONTRAST
TECHNIQUE: Multiplanar, multisequence MR imaging of the shoulder was performed.
No intravenous contrast was administered.

[Series 3: T2 fat-sat · axial · 4.0mm · 0.27mm/px · z∈[-98,+25]mm · 6 of 26 slices shown (1 of 4)]
[im 1/26]
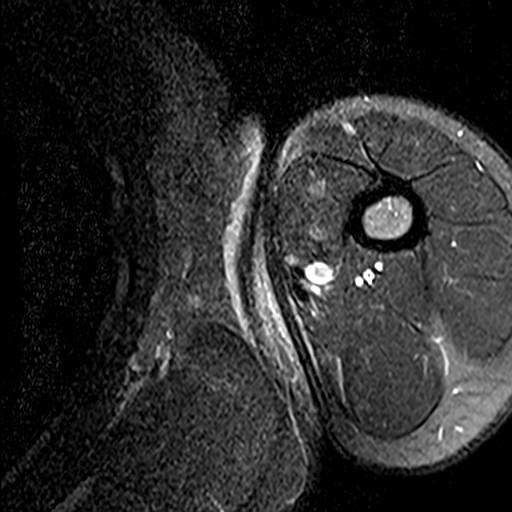
[im 6/26]
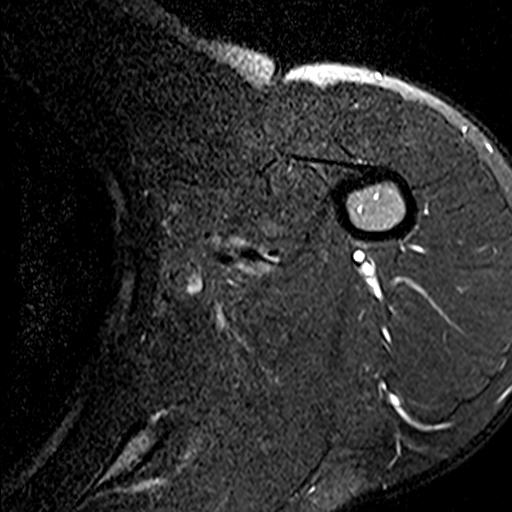
[im 11/26]
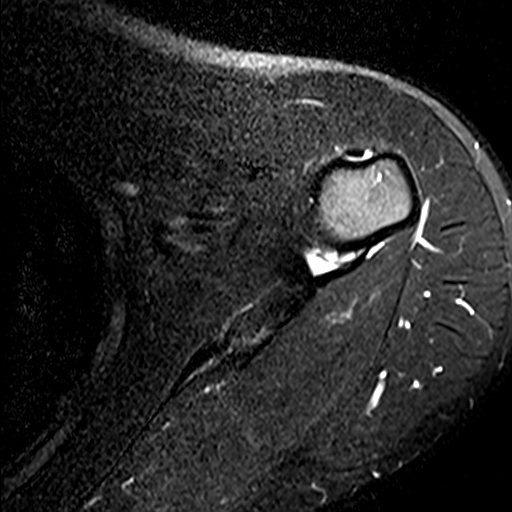
[im 16/26]
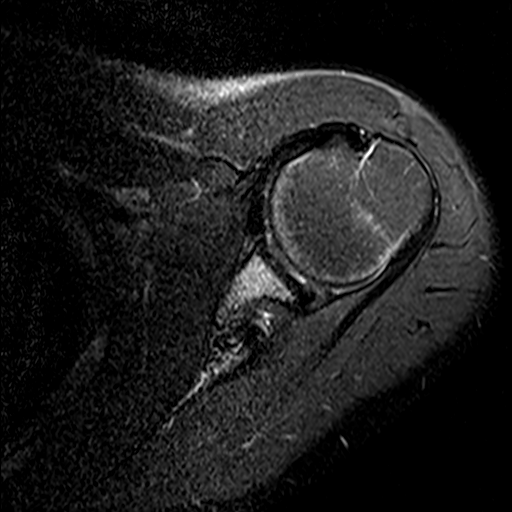
[im 21/26]
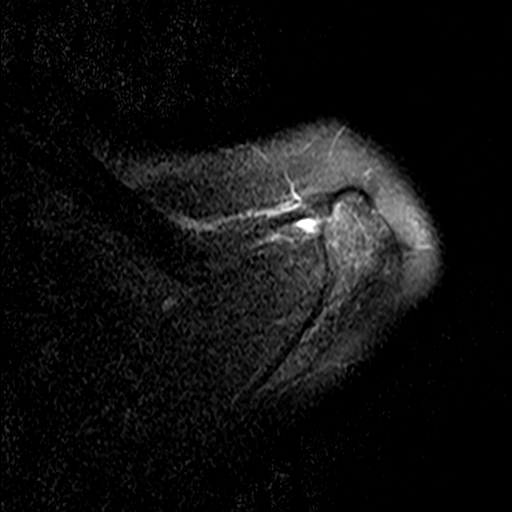
[im 26/26]
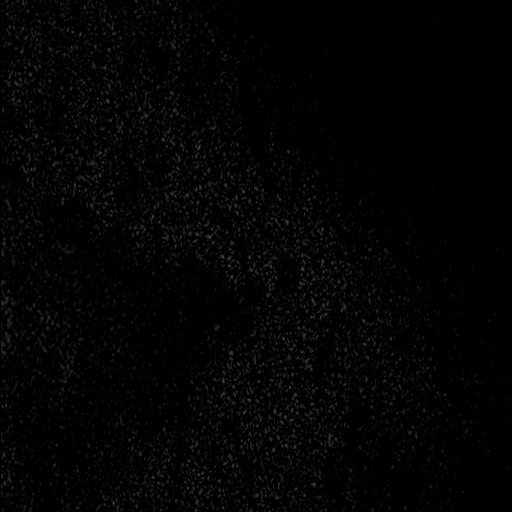

[Series 4: T2 fat-sat · oblique · 4.0mm · 0.59mm/px · 4 of 18 slices shown (2 of 4)]
[im 1/18]
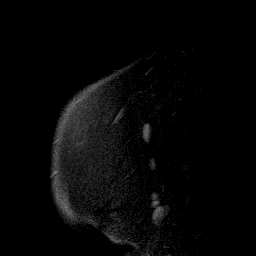
[im 6/18]
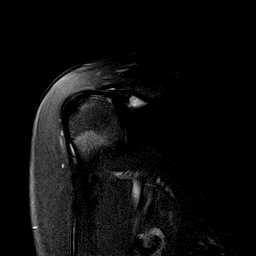
[im 12/18]
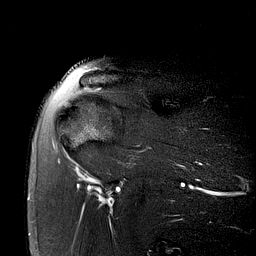
[im 18/18]
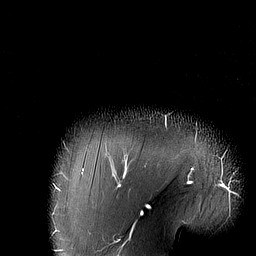

[Series 5: PD · oblique · 4.0mm · 0.59mm/px · 5 of 18 slices shown (1 of 2)]
[im 1/18]
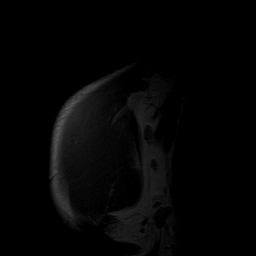
[im 5/18]
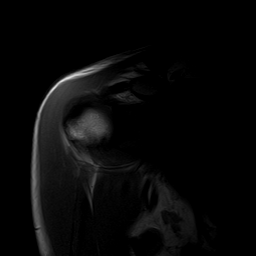
[im 9/18]
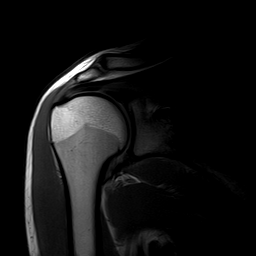
[im 13/18]
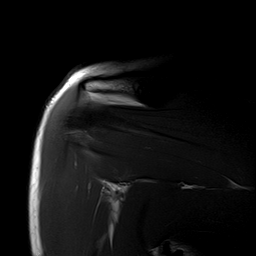
[im 18/18]
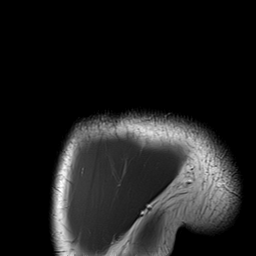

[Series 6: T2 fat-sat · oblique · 4.0mm · 0.59mm/px · 5 of 20 slices shown (3 of 4)]
[im 1/20]
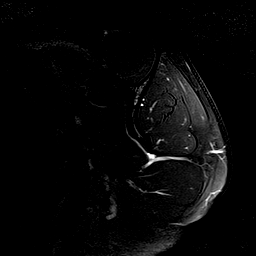
[im 5/20]
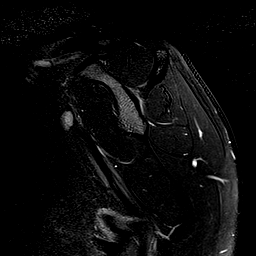
[im 10/20]
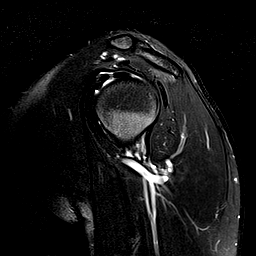
[im 15/20]
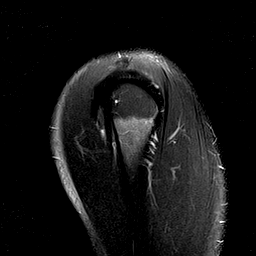
[im 20/20]
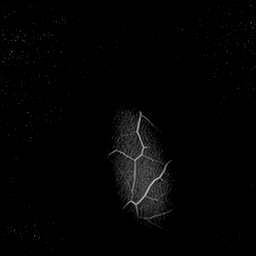

[Series 7: T1 · oblique · 4.0mm · 0.59mm/px · 5 of 20 slices shown]
[im 1/20]
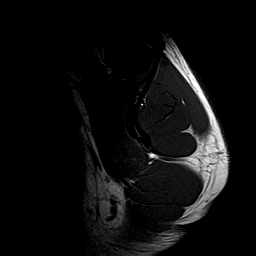
[im 5/20]
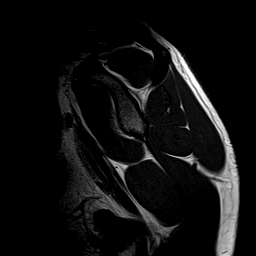
[im 10/20]
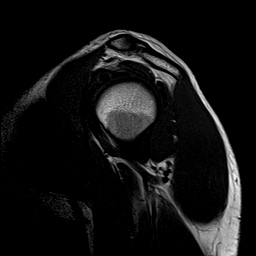
[im 15/20]
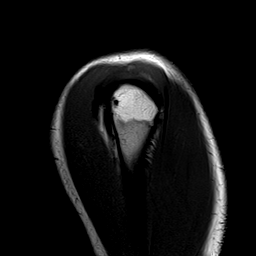
[im 20/20]
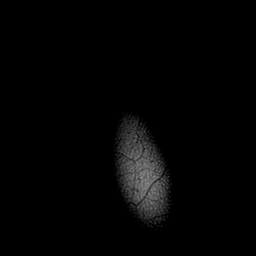

[Series 8: T2 fat-sat · oblique · 4.0mm · 0.59mm/px · 5 of 18 slices shown (4 of 4)]
[im 1/18]
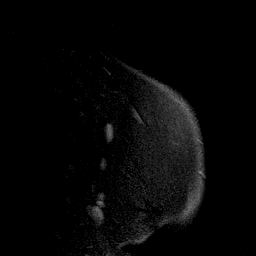
[im 5/18]
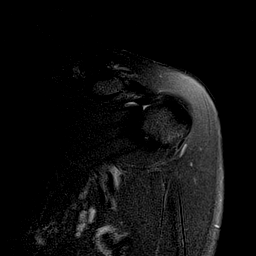
[im 9/18]
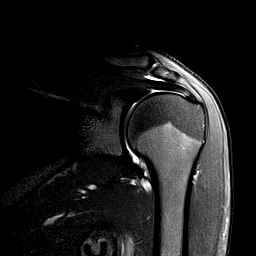
[im 13/18]
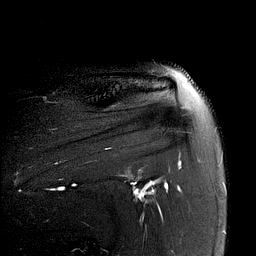
[im 18/18]
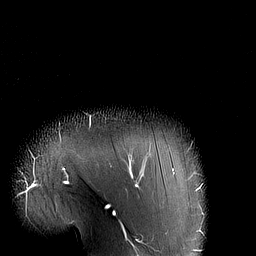

[Series 9: PD · oblique · 4.0mm · 0.59mm/px · 5 of 18 slices shown (2 of 2)]
[im 1/18]
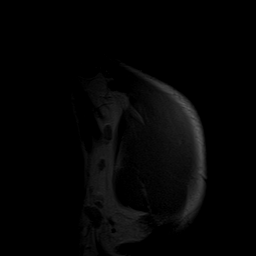
[im 5/18]
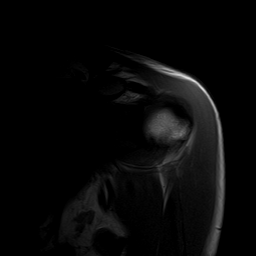
[im 9/18]
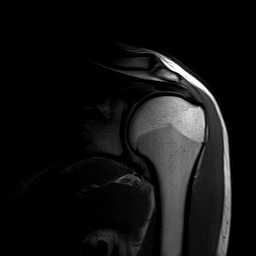
[im 13/18]
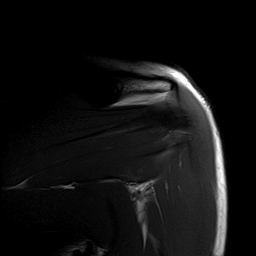
[im 18/18]
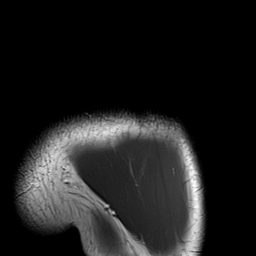

[Series 10: PD fat-sat · axial · 4.0mm · 0.55mm/px · z∈[-81,+18]mm · 5 of 21 slices shown]
[im 1/21]
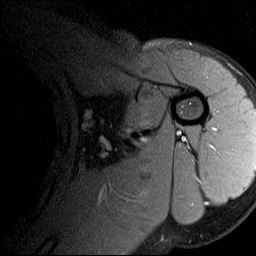
[im 6/21]
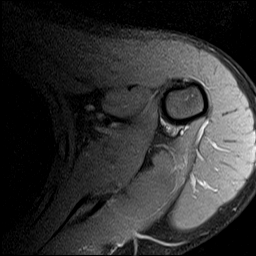
[im 11/21]
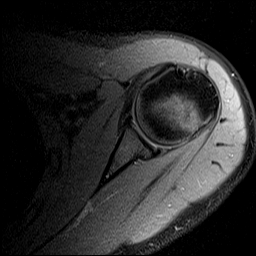
[im 16/21]
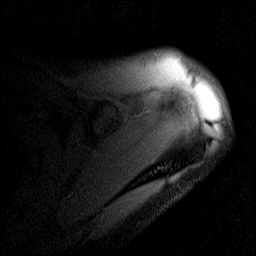
[im 21/21]
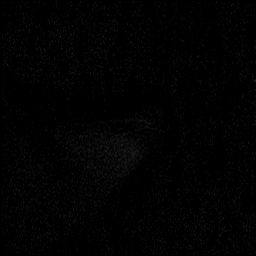

[40 of 40 positions shown; findings below may reference images not displayed]

FINDINGS: Rotator cuff: Very tendinosis of the proximal tendon footprint of
the mid AP dimension of the supraspinatus. The infraspinatus
subscapularis and teres minor are intact.

Muscles: No rotator cuff muscle atrophy, fatty infiltration, or
edema.

Biceps long head: The intra-articular long head of the biceps tendon
is intact.

Acromioclavicular Joint: There is minimal elevation of the
clavicular head with respect to the acromion by approximately 3 mm.
No significant degenerative change. There is normal increased T2
signal from normal vasculature about the acromioclavicular joint. No
abnormal fluid. Trace fluid is seen deep to the acromion. Type 2
acromion.

Glenohumeral Joint: Intact cartilage.

Labrum: Grossly intact, but evaluation is limited by lack of
intraarticular fluid.

Bones:  No acute fracture.

Other: None.
IMPRESSION: :
IMPRESSION: 1. Very mild supraspinatus tendinosis.  No rotator cuff tear.
2. Minimal elevation of the clavicular head with respect to the
acromion. No acute marrow edema or joint effusion to suggest
recent/acute injury.
3. Trace fluid within the subacromial/subdeltoid bursa, just deep to
the acromion.

## 2023-09-13 ENCOUNTER — Encounter (HOSPITAL_COMMUNITY): Payer: Self-pay | Admitting: Internal Medicine

## 2023-09-13 ENCOUNTER — Inpatient Hospital Stay (HOSPITAL_COMMUNITY): Payer: 59

## 2023-09-13 ENCOUNTER — Inpatient Hospital Stay (HOSPITAL_COMMUNITY)
Admission: EM | Admit: 2023-09-13 | Discharge: 2023-09-18 | DRG: 862 | Disposition: A | Discharge status: Home / Self Care | Payer: 59 | Attending: Internal Medicine | Admitting: Internal Medicine

## 2023-09-13 DIAGNOSIS — E119 Type 2 diabetes mellitus without complications: Secondary | ICD-10-CM | POA: Diagnosis present

## 2023-09-13 DIAGNOSIS — L0390 Cellulitis, unspecified: Secondary | ICD-10-CM | POA: Diagnosis not present

## 2023-09-13 DIAGNOSIS — R6520 Severe sepsis without septic shock: Secondary | ICD-10-CM | POA: Diagnosis not present

## 2023-09-13 DIAGNOSIS — Z5181 Encounter for therapeutic drug level monitoring: Secondary | ICD-10-CM

## 2023-09-13 DIAGNOSIS — I38 Endocarditis, valve unspecified: Secondary | ICD-10-CM | POA: Diagnosis not present

## 2023-09-13 DIAGNOSIS — L299 Pruritus, unspecified: Secondary | ICD-10-CM | POA: Diagnosis not present

## 2023-09-13 DIAGNOSIS — R6521 Severe sepsis with septic shock: Secondary | ICD-10-CM | POA: Diagnosis present

## 2023-09-13 DIAGNOSIS — Z1159 Encounter for screening for other viral diseases: Secondary | ICD-10-CM

## 2023-09-13 DIAGNOSIS — Z886 Allergy status to analgesic agent status: Secondary | ICD-10-CM | POA: Diagnosis not present

## 2023-09-13 DIAGNOSIS — E871 Hypo-osmolality and hyponatremia: Secondary | ICD-10-CM | POA: Diagnosis present

## 2023-09-13 DIAGNOSIS — Q600 Renal agenesis, unilateral: Secondary | ICD-10-CM

## 2023-09-13 DIAGNOSIS — L818 Other specified disorders of pigmentation: Secondary | ICD-10-CM

## 2023-09-13 DIAGNOSIS — T8144XA Sepsis following a procedure, initial encounter: Secondary | ICD-10-CM | POA: Diagnosis present

## 2023-09-13 DIAGNOSIS — T402X5A Adverse effect of other opioids, initial encounter: Secondary | ICD-10-CM | POA: Diagnosis not present

## 2023-09-13 DIAGNOSIS — N179 Acute kidney failure, unspecified: Secondary | ICD-10-CM | POA: Diagnosis present

## 2023-09-13 DIAGNOSIS — N3000 Acute cystitis without hematuria: Secondary | ICD-10-CM | POA: Insufficient documentation

## 2023-09-13 DIAGNOSIS — L03116 Cellulitis of left lower limb: Secondary | ICD-10-CM | POA: Diagnosis present

## 2023-09-13 DIAGNOSIS — R509 Fever, unspecified: Secondary | ICD-10-CM | POA: Diagnosis present

## 2023-09-13 DIAGNOSIS — B370 Candidal stomatitis: Secondary | ICD-10-CM | POA: Diagnosis present

## 2023-09-13 DIAGNOSIS — E872 Acidosis, unspecified: Secondary | ICD-10-CM | POA: Diagnosis present

## 2023-09-13 DIAGNOSIS — Z1152 Encounter for screening for COVID-19: Secondary | ICD-10-CM

## 2023-09-13 DIAGNOSIS — R579 Shock, unspecified: Secondary | ICD-10-CM | POA: Diagnosis not present

## 2023-09-13 DIAGNOSIS — E8729 Other acidosis: Secondary | ICD-10-CM

## 2023-09-13 DIAGNOSIS — A419 Sepsis, unspecified organism: Secondary | ICD-10-CM

## 2023-09-13 DIAGNOSIS — Z79899 Other long term (current) drug therapy: Secondary | ICD-10-CM

## 2023-09-13 HISTORY — DX: Renal agenesis, unilateral: Q60.0

## 2023-09-13 LAB — CBC WITH DIFFERENTIAL/PLATELET
Abs Immature Granulocytes: 0.64 10*3/uL — ABNORMAL HIGH (ref 0.00–0.07)
Basophils Absolute: 0.1 10*3/uL (ref 0.0–0.1)
Basophils Relative: 0 %
Eosinophils Absolute: 0 10*3/uL (ref 0.0–0.5)
Eosinophils Relative: 0 %
HCT: 37 % (ref 36.0–46.0)
Hemoglobin: 12.2 g/dL (ref 12.0–15.0)
Immature Granulocytes: 2 %
Lymphocytes Relative: 1 %
Lymphs Abs: 0.3 10*3/uL — ABNORMAL LOW (ref 0.7–4.0)
MCH: 28.8 pg (ref 26.0–34.0)
MCHC: 33 g/dL (ref 30.0–36.0)
MCV: 87.5 fL (ref 80.0–100.0)
Monocytes Absolute: 0.8 10*3/uL (ref 0.1–1.0)
Monocytes Relative: 2 %
Neutro Abs: 36.3 10*3/uL — ABNORMAL HIGH (ref 1.7–7.7)
Neutrophils Relative %: 95 %
Platelets: 365 10*3/uL (ref 150–400)
RBC: 4.23 MIL/uL (ref 3.87–5.11)
RDW: 14.1 % (ref 11.5–15.5)
Smear Review: NORMAL
WBC Morphology: INCREASED
WBC: 38.3 10*3/uL — ABNORMAL HIGH (ref 4.0–10.5)
nRBC: 0 % (ref 0.0–0.2)

## 2023-09-13 LAB — URINALYSIS, ROUTINE W REFLEX MICROSCOPIC
Glucose, UA: NEGATIVE mg/dL
Ketones, ur: 5 mg/dL — AB
Nitrite: NEGATIVE
Protein, ur: 100 mg/dL — AB
RBC / HPF: >50 RBC/hpf (ref 0–5)
Specific Gravity, Urine: 1.041 — ABNORMAL HIGH (ref 1.005–1.030)
WBC, UA: >50 WBC/hpf (ref 0–5)
pH: 5 (ref 5.0–8.0)

## 2023-09-13 LAB — PROTIME-INR
INR: 1.7 — ABNORMAL HIGH (ref 0.8–1.2)
Prothrombin Time: 20 s — ABNORMAL HIGH (ref 11.4–15.2)

## 2023-09-13 LAB — COMPREHENSIVE METABOLIC PANEL
ALT: 13 U/L (ref 0–44)
AST: 24 U/L (ref 15–41)
Albumin: 3.6 g/dL (ref 3.5–5.0)
Alkaline Phosphatase: 59 U/L (ref 38–126)
Anion gap: 18 — ABNORMAL HIGH (ref 5–15)
BUN: 14 mg/dL (ref 6–20)
CO2: 16 mmol/L — ABNORMAL LOW (ref 22–32)
Calcium: 8.3 mg/dL — ABNORMAL LOW (ref 8.9–10.3)
Chloride: 99 mmol/L (ref 98–111)
Creatinine, Ser: 1.09 mg/dL — ABNORMAL HIGH (ref 0.44–1.00)
GFR, Estimated: >60 mL/min (ref >60)
Glucose, Bld: 109 mg/dL — ABNORMAL HIGH (ref 70–99)
Potassium: 3.6 mmol/L (ref 3.5–5.1)
Sodium: 133 mmol/L — ABNORMAL LOW (ref 135–145)
Total Bilirubin: 0.7 mg/dL (ref 0.0–1.2)
Total Protein: 7.2 g/dL (ref 6.5–8.1)

## 2023-09-13 LAB — RESP PANEL BY RT-PCR (RSV, FLU A&B, COVID)  RVPGX2
Influenza A by PCR: NEGATIVE
Influenza B by PCR: NEGATIVE
Resp Syncytial Virus by PCR: NEGATIVE
SARS Coronavirus 2 by RT PCR: NEGATIVE

## 2023-09-13 LAB — APTT: aPTT: 37 s — ABNORMAL HIGH (ref 24–36)

## 2023-09-13 LAB — HCG, SERUM, QUALITATIVE: Preg, Serum: NEGATIVE

## 2023-09-13 LAB — I-STAT CG4 LACTIC ACID, ED: Lactic Acid, Venous: 3.6 mmol/L (ref 0.5–1.9)

## 2023-09-13 MED ORDER — ENOXAPARIN SODIUM 40 MG/0.4ML IJ SOSY
40.0000 mg | PREFILLED_SYRINGE | INTRAMUSCULAR | Status: DC
  Filled 2023-09-13: qty 0.4

## 2023-09-13 MED ORDER — LACTATED RINGERS IV BOLUS (SEPSIS): 250.0000 mL | Freq: Once | INTRAVENOUS | Status: DC

## 2023-09-13 MED ORDER — ACETAMINOPHEN 650 MG RE SUPP: 650.0000 mg | Freq: Four times a day (QID) | RECTAL | Status: DC | PRN

## 2023-09-13 MED ORDER — SODIUM CHLORIDE 0.9% FLUSH: 3.0000 mL | INTRAVENOUS | Status: DC | PRN

## 2023-09-13 MED ORDER — MORPHINE SULFATE (PF) 2 MG/ML IV SOLN: 1.0000 mg | INTRAVENOUS | Status: DC | PRN

## 2023-09-13 MED ORDER — PIPERACILLIN-TAZOBACTAM 3.375 G IVPB
3.3750 g | Freq: Three times a day (TID) | INTRAVENOUS | Status: DC
  Administered 2023-09-14 – 2023-09-15 (×5): 3.375 g via INTRAVENOUS
  Filled 2023-09-13 (×6): qty 50

## 2023-09-13 MED ORDER — SODIUM CHLORIDE 0.9% FLUSH
3.0000 mL | Freq: Two times a day (BID) | INTRAVENOUS | Status: DC
  Administered 2023-09-14 – 2023-09-18 (×8): 3 mL via INTRAVENOUS

## 2023-09-13 MED ORDER — ONDANSETRON HCL 4 MG PO TABS: 4.0000 mg | ORAL_TABLET | Freq: Four times a day (QID) | ORAL | Status: DC | PRN

## 2023-09-13 MED ORDER — SODIUM CHLORIDE 0.9 % IV SOLN: 250.0000 mL | INTRAVENOUS | Status: AC | PRN

## 2023-09-13 MED ORDER — MORPHINE SULFATE (PF) 2 MG/ML IV SOLN
2.0000 mg | INTRAVENOUS | Status: DC | PRN
  Administered 2023-09-14 (×2): 2 mg via INTRAVENOUS
  Filled 2023-09-13 (×2): qty 1

## 2023-09-13 MED ORDER — VANCOMYCIN HCL 1250 MG/250ML IV SOLN
1250.0000 mg | INTRAVENOUS | Status: DC
  Administered 2023-09-14: 1250 mg via INTRAVENOUS
  Filled 2023-09-13: qty 250

## 2023-09-13 MED ORDER — ONDANSETRON HCL 4 MG/2ML IJ SOLN
4.0000 mg | Freq: Four times a day (QID) | INTRAMUSCULAR | Status: DC | PRN
  Administered 2023-09-14 – 2023-09-16 (×4): 4 mg via INTRAVENOUS
  Filled 2023-09-13 (×4): qty 2

## 2023-09-13 MED ORDER — VANCOMYCIN HCL IN DEXTROSE 1-5 GM/200ML-% IV SOLN
1000.0000 mg | Freq: Once | INTRAVENOUS | Status: DC
Start: 2023-09-13 — End: 2023-09-13

## 2023-09-13 MED ORDER — LACTATED RINGERS IV BOLUS (SEPSIS)
500.0000 mL | Freq: Once | INTRAVENOUS | Status: AC
  Administered 2023-09-14: 500 mL via INTRAVENOUS

## 2023-09-13 MED ORDER — LACTATED RINGERS IV SOLN: INTRAVENOUS | Status: DC

## 2023-09-13 MED ORDER — SODIUM CHLORIDE 0.9% FLUSH
3.0000 mL | Freq: Two times a day (BID) | INTRAVENOUS | Status: DC
  Administered 2023-09-14 (×3): 3 mL via INTRAVENOUS

## 2023-09-13 MED ORDER — LACTATED RINGERS IV SOLN: INTRAVENOUS | Status: AC

## 2023-09-13 MED ORDER — SENNOSIDES-DOCUSATE SODIUM 8.6-50 MG PO TABS: 1.0000 | ORAL_TABLET | Freq: Every evening | ORAL | Status: DC | PRN

## 2023-09-13 MED ORDER — SODIUM CHLORIDE 0.9 % IV SOLN
2.0000 g | Freq: Once | INTRAVENOUS | Status: AC
  Administered 2023-09-13: 2 g via INTRAVENOUS
  Filled 2023-09-13: qty 20

## 2023-09-13 MED ORDER — ACETAMINOPHEN 325 MG PO TABS
650.0000 mg | ORAL_TABLET | Freq: Four times a day (QID) | ORAL | Status: DC | PRN
  Administered 2023-09-14 – 2023-09-15 (×4): 650 mg via ORAL
  Filled 2023-09-13 (×5): qty 2

## 2023-09-13 MED ORDER — LACTATED RINGERS IV BOLUS (SEPSIS)
1000.0000 mL | Freq: Once | INTRAVENOUS | Status: AC
  Administered 2023-09-14: 1000 mL via INTRAVENOUS

## 2023-09-13 NOTE — ED Provider Triage Note (Signed)
 Emergency Medicine Provider Triage Evaluation Note  Julie Rubio , a 18 y.o. female  was evaluated in triage.  Pt complains of pain to her left leg.  She got a tattoo a couple days ago when she is concerned is now infected.  She developed stiffness of leg yesterday.  Today she had been febrile to 102 degrees.  Has taken Tylenol .  She is tachycardic.  Significant pain to her left upper leg.  Review of Systems  Positive: As above Negative: As above  Physical Exam  BP 97/70   Pulse (!) 126   Temp 97.9 F (36.6 C) (Oral)   Resp 18   SpO2 100%  Gen:   Awake, no distress   Resp:  Normal effort  MSK:   Moves extremities without difficulty  Other:    Medical Decision Making  Medically screening exam initiated at 6:56 PM.  Appropriate orders placed.  Julie Rubio was informed that the remainder of the evaluation will be completed by another provider, this initial triage assessment does not replace that evaluation, and the importance of remaining in the ED until their evaluation is complete.  Sepsis labs ordered.   Hildegard Loge, PA-C 09/13/23 [id number]

## 2023-09-13 NOTE — ED Triage Notes (Signed)
 Pt states that two days ago she got a tattoo on her L leg and upper thigh. She began having swelling and drainage since then.

## 2023-09-13 NOTE — Sepsis Progress Note (Signed)
 Elink monitoring for the code sepsis protocol.

## 2023-09-13 NOTE — H&P (Signed)
 History and Physical    Julie Rubio:[id number] DOB: 08-08-2006 DOA: 09/13/2023  PCP: [facility] [place]   Patient coming from: Home   Chief Complaint:  Chief Complaint  Patient presents with   Wound Infection   ED TRIAGE note:Pt states that two days ago she got a tattoo on her L leg and upper thigh. She began having swelling and drainage since then.   HPI:  Julie Rubio is a 18 y.o. female with medical history significant of GERD presented to emergency department with complaining of left-sided thigh pain.  Patient reported that she had a tattoo of the left thigh 3 days ago.  Following that she have developing systemic symptoms including fever, body, tachycardia nausea and vomiting.  Patient's left thigh is swollen and edematous. Patient denies any other symptoms.   ED Course:  At presentation to ED patient is tachycardic and hypotensive. Respiratory panel negative. CMP showing low sodium 133, low bicarb 16, elevated creatinine 1.09 and elevated anion gap 18. CBC showing significant leukocytosis 38. Elevated pro time INR. Blood cultures are in process. Elevated lactic acid 3.6 UA amber cloudy appearance, hemoglobin positive, leukocyte esterase moderate and bacteria positive. Pending hepatitis panel. EKG shows sinus tachycardia heart rate 124.  ED physician Dr. Kerry reported that physical exam showing left-sided upper thigh area cellulitis with any concern for crepitus and compartment syndrome.  As patient having significant sepsis obtaining CT scan of the left-sided extremity to rule out any underlying abscess and holding surgery consult based on the CT finding.  In the ED patient received around 2 L of LR bolus and currently on LR 150 cc/h.  Patient also treated with vancomycin  and ceftriaxone .  Hospitalist has been contacted for further evaluation management of sepsis in the context of left-sided leg cellulitis, AKI and UTI.   Significant  labs in the ED: Lab Orders         Resp panel by RT-PCR (RSV, Flu A&B, Covid) Anterior Nasal Swab         Blood Culture (routine x 2)         Urine Culture         Urine Culture (for pregnant, neutropenic or urologic patients or patients with an indwelling urinary catheter)         Comprehensive metabolic panel         CBC with Differential         Protime-INR         APTT         hCG, serum, qualitative         Urinalysis, Routine w reflex microscopic -Urine, Clean Catch         Hepatitis panel, acute         HIV Antibody (routine testing w rflx)         CBC         Comprehensive metabolic panel         I-Stat Lactic Acid, ED       Review of Systems:  Review of Systems  Constitutional:  Positive for chills and fever. Negative for diaphoresis and weight loss.  Respiratory:  Negative for cough, sputum production and shortness of breath.   Cardiovascular:  Negative for chest pain and palpitations.  Gastrointestinal:  Positive for nausea and vomiting. Negative for abdominal pain, constipation, diarrhea and heartburn.  Musculoskeletal:        Left-sided thigh pain  Neurological:  Negative for dizziness and headaches.  Endo/Heme/Allergies:  Negative for environmental allergies. Does not bruise/bleed easily.  Psychiatric/Behavioral:  The patient is not nervous/anxious.   All other systems reviewed and are negative.   Past Medical History:  Diagnosis Date   Abdominal migraine    Medical history non-contributory    Reflux esophagitis     Past Surgical History:  Procedure Laterality Date   ESOPHAGOGASTRODUODENOSCOPY ENDOSCOPY       reports that she has never smoked. She has never used smokeless tobacco. She reports that she does not drink alcohol and does not use drugs.  Allergies  Allergen Reactions   Nsaids     Pt has one kidney    Family History  Problem Relation Age of Onset   Raynaud syndrome Mother     Prior to Admission medications   Medication Sig Start Date  End Date Taking? Authorizing Provider  omeprazole (PRILOSEC) 20 MG capsule Take 20 mg by mouth daily. 01/10/18  Yes [provider]     Physical Exam: Vitals:   09/14/23 0015 09/14/23 0130 09/14/23 0200 09/14/23 0227  BP:  101/77 93/61 92/67   Pulse:  (!) 134 (!) 128 (!) 120  Resp:    16  Temp: 100.2 F (37.9 C)     TempSrc: Oral     SpO2:  100% 100% 100%    Physical Exam Constitutional:      Appearance: She is ill-appearing.  HENT:     Mouth/Throat:     Mouth: Mucous membranes are dry.  Eyes:     Pupils: Pupils are equal, round, and reactive to light.  Cardiovascular:     Rate and Rhythm: Regular rhythm. Tachycardia present.     Pulses: Normal pulses.     Heart sounds: Normal heart sounds.  Pulmonary:     Effort: Pulmonary effort is normal.     Breath sounds: Normal breath sounds.  Abdominal:     General: Bowel sounds are normal.  Musculoskeletal:        General: Tenderness present.     Cervical back: Neck supple.     Right lower leg: No edema.     Left lower leg: No edema.     Comments: Left-sided thigh anterolateral erythema and tenderness on palpation.  There is no external crepitus, pus formation, and drainage.  Skin:    General: Skin is dry.  Neurological:     Mental Status: She is alert and oriented to person, place, and time.      Media Information   Document Information  Photos    09/13/2023 23:11  Attached To:  Hospital Encounter on 09/13/23  Source Information  Yonael Tulloch, MD  [facility]  Document History      Media Information   Document Information  Photos    09/13/2023 23:11  Attached To:  Hospital Encounter on 09/13/23  Source Information  Lee Kingfisher, MD  [facility]  Document History    Labs on Admission: I have personally reviewed following labs and imaging studies  CBC: Recent Labs  Lab 09/13/23 1910  WBC 38.3*  NEUTROABS 36.3*  HGB 12.2  HCT 37.0  MCV 87.5  PLT 365   Basic Metabolic  Panel: Recent Labs  Lab 09/13/23 1910  NA 133*  K 3.6  CL 99  CO2 16*  GLUCOSE 109*  BUN 14  CREATININE 1.09*  CALCIUM 8.3*   GFR: CrCl cannot be calculated (Unknown ideal weight.). Liver Function Tests: Recent Labs  Lab 09/13/23 1910  AST 24  ALT 13  ALKPHOS 59  BILITOT 0.7  PROT 7.2  ALBUMIN 3.6   No results for input(s): LIPASE, AMYLASE in the last 168 hours. No results for input(s): AMMONIA in the last 168 hours. Coagulation Profile: Recent Labs  Lab 09/13/23 1910  INR 1.7*   Cardiac Enzymes: No results for input(s): CKTOTAL, CKMB, CKMBINDEX, TROPONINI, TROPONINIHS in the last 168 hours. BNP (last 3 results) No results for input(s): BNP in the last 8760 hours. HbA1C: No results for input(s): HGBA1C in the last 72 hours. CBG: No results for input(s): GLUCAP in the last 168 hours. Lipid Profile: No results for input(s): CHOL, HDL, LDLCALC, TRIG, CHOLHDL, LDLDIRECT in the last 72 hours. Thyroid Function Tests: No results for input(s): TSH, T4TOTAL, FREET4, T3FREE, THYROIDAB in the last 72 hours. Anemia Panel: No results for input(s): VITAMINB12, FOLATE, FERRITIN, TIBC, IRON, RETICCTPCT in the last 72 hours. Urine analysis:    Component Value Date/Time   COLORURINE AMBER (A) 09/13/2023 1941   APPEARANCEUR CLOUDY (A) 09/13/2023 1941   LABSPEC 1.041 (H) 09/13/2023 1941   PHURINE 5.0 09/13/2023 1941   GLUCOSEU NEGATIVE 09/13/2023 1941   HGBUR MODERATE (A) 09/13/2023 1941   BILIRUBINUR MODERATE (A) 09/13/2023 1941   KETONESUR 5 (A) 09/13/2023 1941   PROTEINUR 100 (A) 09/13/2023 1941   NITRITE NEGATIVE 09/13/2023 1941   LEUKOCYTESUR MODERATE (A) 09/13/2023 1941    Radiological Exams on Admission: I have personally reviewed images CT FEMUR LEFT W CONTRAST Result Date: 09/14/2023 CLINICAL DATA:  Leg pain, chronic, cellulitis of left thigh, sepsis, evaluate for focal abscess or deeper lesion. Tattoo a  couple of days ago now concern for infection. Stiffness of leg. Fever. EXAM: CT OF THE LOWER LEFT EXTREMITY WITH CONTRAST TECHNIQUE: Multidetector CT imaging of the lower left extremity was performed according to the standard protocol following intravenous contrast administration. RADIATION DOSE REDUCTION: This exam was performed according to the departmental dose-optimization program which includes automated exposure control, adjustment of the mA and/or kV according to patient size and/or use of iterative reconstruction technique. CONTRAST:  75mL OMNIPAQUE  IOHEXOL  350 MG/ML SOLN COMPARISON:  None Available. FINDINGS: Bones/Joint/Cartilage No acute fracture or dislocation. No evidence of osteomyelitis. No knee joint effusion. Ligaments Suboptimally assessed by CT. Muscles and Tendons No evidence of deep tissue infection. Soft tissues Mild subcutaneous edema in the proximal left thigh with mild skin thickening. No organized fluid collection or abscess. IMPRESSION: 1. Mild subcutaneous edema in the proximal left thigh with mild skin thickening. No organized fluid collection or abscess. 2. No evidence of deep tissue infection. Electronically Signed   By: Norman Gatlin M.D.   On: 09/14/2023 02:01     EKG: My personal interpretation of EKG shows: EKG showing sinus tachycardia heart rate 124.    Assessment/Plan: Principal Problem:   Sepsis due to cellulitis [facility]) Active Problems:   Cellulitis of left thigh   AKI (acute kidney injury) (HCC)   High anion gap metabolic acidosis   Acute cystitis   Severe sepsis (HCC)   Hyponatremia    Assessment and Plan: Sepsis secondary to left thigh cellulitis > Presented to the emergency department complaining of left-sided thigh pain around the tattoo area.  Patient got a tattoo 3 days ago.  Following this she developed symptoms include fever, body ache, severe left-sided thigh pain and nausea. -Patient is tachycardic, hypotensive, CBC showing significant  leukocytosis 38.3 and elevated lactic acid 3.6.  Physical exam showed evidence of left-sided anterolateral thigh skin erythema and tenderness. -In the ED sepsis protocol has been initiated, patient  received around 2 L of LR currently on LR 150 cc/h.  Also received vancomycin  and ceftriaxone .  Blood cultures has been drawn.  UA showed also evidence of UTI. -Obtaining CT of the left lower extremity with contrast to rule out any underlying abscess formation and fasciitis.  Based on the findings we will reach out to general surgery for consult. -Plan to continue Vanco and Zosyn  with pharmacy consult. -Will follow-up with CT scan finding. -Checking HIV and hepatitis panel. - Continue pain control with morphine  2 mg every 2 hour as needed for moderate and severe pain - Follow-up with blood cultures and urine culture result. Addendum - CT scan of the left-sided extremity showed mild subcutaneous edema in the proximal left thigh with mild skin thickening.  No organized fluid collection, abscess, and no evidence of deep tissue infection. -Given CT scan showing mild subcutaneous edema without any concern for gas formation and no evidence of necrotizing fasciitis there is no need for consult general surgery at movement.  Plan to continue treatment with IV fluid resuscitation and broad-spectrum antibiotic coverage at this moment.  Acute cystitis -Showed evidence of UTI.  Pending urine culture. - Currently on broad-spectrum antibiotic coverage with Vanco and Zosyn .  Follow-up with urine culture result.  Prerenal acute kidney injury - Creatinine 1.09.  Prerenal acute kidney injury in the setting of hypotension and sepsis. -No IV fluid hydration.  Continue to monitor renal function, avoid nephrotoxic agent.   Anion gap metabolic acidosis - Low bicarb 16 and elevated anion gap 18.  Uncomfortable acidosis in the setting of sepsis and lactic acidosis contributing as well - Currently treating for sepsis.  Giving  1 amp of bicarb. - Continue monitor bicarb level.  Hyponatremia -Slight low sodium 133.  Hyponatremia in setting of AKI. - Continue to monitor   DVT prophylaxis:  Lovenox  Code Status:  Full Code Diet: Regular diet Family Communication:   Family was present at bedside, at the time of interview. Opportunity was given to ask question and all questions were answered satisfactorily.  Disposition Plan: Need to follow-up with blood culture result for antibiotic guidance. Consults: None at this time. Admission status:   Inpatient, Step Down Unit  Severity of Illness: The appropriate patient status for this patient is INPATIENT. Inpatient status is judged to be reasonable and necessary in order to provide the required intensity of service to ensure the patient's safety. The patient's presenting symptoms, physical exam findings, and initial radiographic and laboratory data in the context of their chronic comorbidities is felt to place them at high risk for further clinical deterioration. Furthermore, it is not anticipated that the patient will be medically stable for discharge from the hospital within 2 midnights of admission.   * I certify that at the point of admission it is my clinical judgment that the patient will require inpatient hospital care spanning beyond 2 midnights from the point of admission due to high intensity of service, high risk for further deterioration and high frequency of surveillance required.DEWAINE    Trevyon Swor, MD [facility]  How to contact the [facility] Attending or Consulting provider 7A - 7P or covering provider during after hours 7P -7A, for this patient.  Check the care team in [facility] and look for a) attending/consulting [facility] provider listed and b) the [facility] team listed Log into [email or web address] and use [place]'s universal password to access. If you do not have the password, please contact the hospital operator. Locate the [facility] provider you are looking for  under [facility] and page to a number that you can be directly reached. If you still have difficulty reaching the provider, please page the [facility] (Director on Call) for the Hospitalists listed on amion for assistance.  09/14/2023, 3:16 AM

## 2023-09-13 NOTE — Progress Notes (Signed)
 Pharmacy Antibiotic Note  Julie Rubio is a 18 y.o. female for which pharmacy has been consulted for vancomycin  and zosyn  dosing for sepsis. Patient presenting with fever, body aches, tachycardia, n/v following tattoo 3 days ago.  SCr 1.09 WBC 38.3; LA 3.6; T 97.9; HR 118; RR 18 COVID neg / flu neg  Plan: Zosyn  3.375g IV q8h (4 hour infusion) Vancomycin  1250 mg q24hr (eAUC 504.1) unless change in renal function Monitor WBC, fever, renal function, cultures De-escalate when able Levels at steady state     Temp (24hrs), Avg:97.9 F (36.6 C), Min:97.9 F (36.6 C), Max:97.9 F (36.6 C)  Recent Labs  Lab 09/13/23 1910 09/13/23 1921  WBC 38.3*  --   CREATININE 1.09*  --   LATICACIDVEN  --  3.6*    CrCl cannot be calculated (Unknown ideal weight.).    Allergies  Allergen Reactions   Nsaids     Pt has one kidney   Microbiology results: Pending  Thank you for allowing pharmacy to be a part of this patient's care.  Dorn Buttner, PharmD, BCPS 09/13/2023 11:13 PM ED Clinical Pharmacist -  [phone]

## 2023-09-13 NOTE — ED Provider Notes (Signed)
 [facility] Provider Note   CSN: [id number] Arrival date & time: 09/13/23  1844     History  Chief Complaint  Patient presents with   Wound Infection    Julie Rubio is a 18 y.o. female.  HPI   18 year old female presents emergency department with left thigh issue.  Patient got a tattoo about 3 days ago.  Following that she developed systemic symptoms including fever, body aches, tachycardia, nausea/vomiting.  The left thigh is swollen and erythematous.  She denies any sore throat, cough, diarrhea, chest pain, abdominal pain, urinary symptoms.  Home Medications Prior to Admission medications   Medication Sig Start Date End Date Taking? Authorizing Provider  omeprazole (PRILOSEC) 20 MG capsule Take 20 mg by mouth daily. 01/10/18  Yes [provider]      Allergies    Nsaids    Review of Systems   Review of Systems  Constitutional:  Positive for chills and fever.  Respiratory:  Negative for shortness of breath.   Cardiovascular:  Negative for chest pain.  Gastrointestinal:  Positive for nausea and vomiting. Negative for abdominal pain and diarrhea.  Skin:  Positive for rash.       Left thigh tattoo/redness/rash  Neurological:  Negative for headaches.    Physical Exam Updated Vital Signs BP 101/69   Pulse (!) 121   Temp 97.9 F (36.6 C) (Oral)   Resp 18   SpO2 100%  Physical Exam Vitals and nursing note reviewed.  Constitutional:      Appearance: Normal appearance.  HENT:     Head: Normocephalic.     Mouth/Throat:     Mouth: Mucous membranes are moist.  Cardiovascular:     Rate and Rhythm: Normal rate.  Pulmonary:     Effort: Pulmonary effort is normal. No respiratory distress.  Abdominal:     Palpations: Abdomen is soft.     Tenderness: There is no abdominal tenderness.  Musculoskeletal:     Comments: Left thigh has a tattoo involving the entire lateral portion that is raised, with surrounding  erythema, foul odor, small area of discharge.  The thigh is tender and indurated around the tattoo but otherwise the inner compartment is soft, good blood flow to the left foot  Skin:    General: Skin is warm.  Neurological:     Mental Status: She is alert and oriented to person, place, and time. Mental status is at baseline.  Psychiatric:        Mood and Affect: Mood normal.     ED Results / Procedures / Treatments   Labs (all labs ordered are listed, but only abnormal results are displayed) Labs Reviewed  CBC WITH DIFFERENTIAL/PLATELET - Abnormal; Notable for the following components:      Result Value   WBC 38.3 (*)    Neutro Abs 36.3 (*)    Lymphs Abs 0.3 (*)    Abs Immature Granulocytes 0.64 (*)    All other components within normal limits  PROTIME-INR - Abnormal; Notable for the following components:   Prothrombin Time 20.0 (*)    INR 1.7 (*)    All other components within normal limits  APTT - Abnormal; Notable for the following components:   aPTT 37 (*)    All other components within normal limits  URINALYSIS, ROUTINE W REFLEX MICROSCOPIC - Abnormal; Notable for the following components:   Color, Urine AMBER (*)    APPearance CLOUDY (*)    Specific Gravity,  Urine 1.041 (*)    Hgb urine dipstick MODERATE (*)    Bilirubin Urine MODERATE (*)    Ketones, ur 5 (*)    Protein, ur 100 (*)    Leukocytes,Ua MODERATE (*)    Bacteria, UA RARE (*)    Non Squamous Epithelial 0-5 (*)    All other components within normal limits  I-STAT CG4 LACTIC ACID, ED - Abnormal; Notable for the following components:   Lactic Acid, Venous 3.6 (*)    All other components within normal limits  RESP PANEL BY RT-PCR (RSV, FLU A&B, COVID)  RVPGX2  CULTURE, BLOOD (ROUTINE X 2)  CULTURE, BLOOD (ROUTINE X 2)  HCG, SERUM, QUALITATIVE  COMPREHENSIVE METABOLIC PANEL  HEPATITIS PANEL, ACUTE  I-STAT CG4 LACTIC ACID, ED    EKG None  Radiology No results found.  Procedures .Critical  Care  Performed by: Bari Roxie HERO, DO Authorized by: Bari Roxie HERO, DO   Critical care provider statement:    Critical care time (minutes):  30   Critical care time was exclusive of:  Separately billable procedures and treating other patients   Critical care was necessary to treat or prevent imminent or life-threatening deterioration of the following conditions:  Sepsis   Critical care was time spent personally by me on the following activities:  Development of treatment plan with patient or surrogate, discussions with consultants, evaluation of patient's response to treatment, examination of patient, ordering and review of laboratory studies, ordering and review of radiographic studies, ordering and performing treatments and interventions, pulse oximetry, re-evaluation of patient's condition and review of old charts   I assumed direction of critical care for this patient from another provider in my specialty: no     Care discussed with: admitting provider       Medications Ordered in ED Medications  lactated ringers  infusion (has no administration in time range)  lactated ringers  bolus 1,000 mL (has no administration in time range)    And  lactated ringers  bolus 500 mL (has no administration in time range)    And  lactated ringers  bolus 250 mL (has no administration in time range)  cefTRIAXone  (ROCEPHIN ) 2 g in sodium chloride  0.9 % 100 mL IVPB (has no administration in time range)  vancomycin  (VANCOCIN ) IVPB 1000 mg/200 mL premix (has no administration in time range)    ED Course/ Medical Decision Making/ A&P Clinical Course as of 09/13/23 2145  Wed Sep 13, 2023  2025 WBC(!): 38.3 [AA]    Clinical Course User Index [AA] Hildegard Loge, PA-C                                 Medical Decision Making Amount and/or Complexity of Data Reviewed Labs: ordered.  Risk Prescription drug management.   18 year old female presents emergency department with left leg redness and  swelling secondary to a tattoo that she had placed.  Now coming in with systemic symptoms.  Is tachycardic on arrival.  Workup is concerning for SIRS criteria, sepsis most likely from cellulitis of left thigh.  She has had nausea/vomiting but abdomen is soft, abdominal labs are unremarkable.  Urinalysis does look suspicious for UTI, patient denies any acute dysuria or vaginal complaints.  Left thigh is erythematous, indurated and tender with a foul odor.  Most likely the source of infection.  Tetanus is up-to-date in the last 3 years.  Septic protocol ordered, antibiotics ordered.  Patients evaluation and results  requires admission for further treatment and care.  Spoke with hospitalist, reviewed patient's ED course and they accept admission.  Patient agrees with admission plan, offers no new complaints and is stable/unchanged at time of admit.        Final Clinical Impression(s) / ED Diagnoses Final diagnoses:  None    Rx / DC Orders ED Discharge Orders     None         Bari Roxie HERO, DO 09/13/23 2244

## 2023-09-14 ENCOUNTER — Encounter (HOSPITAL_COMMUNITY): Payer: Self-pay | Admitting: Internal Medicine

## 2023-09-14 ENCOUNTER — Other Ambulatory Visit: Payer: Self-pay

## 2023-09-14 DIAGNOSIS — A419 Sepsis, unspecified organism: Secondary | ICD-10-CM | POA: Diagnosis not present

## 2023-09-14 DIAGNOSIS — R6521 Severe sepsis with septic shock: Secondary | ICD-10-CM

## 2023-09-14 DIAGNOSIS — L03116 Cellulitis of left lower limb: Secondary | ICD-10-CM | POA: Diagnosis not present

## 2023-09-14 LAB — COMPREHENSIVE METABOLIC PANEL
ALT: 11 U/L (ref 0–44)
AST: 20 U/L (ref 15–41)
Albumin: 2.5 g/dL — ABNORMAL LOW (ref 3.5–5.0)
Alkaline Phosphatase: 52 U/L (ref 38–126)
Anion gap: 9 (ref 5–15)
BUN: 12 mg/dL (ref 6–20)
CO2: 19 mmol/L — ABNORMAL LOW (ref 22–32)
Calcium: 7.3 mg/dL — ABNORMAL LOW (ref 8.9–10.3)
Chloride: 103 mmol/L (ref 98–111)
Creatinine, Ser: 1.05 mg/dL — ABNORMAL HIGH (ref 0.44–1.00)
GFR, Estimated: >60 mL/min (ref >60)
Glucose, Bld: 99 mg/dL (ref 70–99)
Potassium: 3.9 mmol/L (ref 3.5–5.1)
Sodium: 131 mmol/L — ABNORMAL LOW (ref 135–145)
Total Bilirubin: 0.4 mg/dL (ref 0.0–1.2)
Total Protein: 5 g/dL — ABNORMAL LOW (ref 6.5–8.1)

## 2023-09-14 LAB — HEPATITIS PANEL, ACUTE
HCV Ab: NONREACTIVE
Hep A IgM: NONREACTIVE
Hep B C IgM: NONREACTIVE
Hepatitis B Surface Ag: NONREACTIVE

## 2023-09-14 LAB — CBC WITH DIFFERENTIAL/PLATELET
Abs Immature Granulocytes: 1.82 10*3/uL — ABNORMAL HIGH (ref 0.00–0.07)
Basophils Absolute: 0.1 10*3/uL (ref 0.0–0.1)
Basophils Relative: 0 %
Eosinophils Absolute: 0 10*3/uL (ref 0.0–0.5)
Eosinophils Relative: 0 %
HCT: 29.5 % — ABNORMAL LOW (ref 36.0–46.0)
Hemoglobin: 10.2 g/dL — ABNORMAL LOW (ref 12.0–15.0)
Immature Granulocytes: 5 %
Lymphocytes Relative: 1 %
Lymphs Abs: 0.3 10*3/uL — ABNORMAL LOW (ref 0.7–4.0)
MCH: 29.6 pg (ref 26.0–34.0)
MCHC: 34.6 g/dL (ref 30.0–36.0)
MCV: 85.5 fL (ref 80.0–100.0)
Monocytes Absolute: 0.7 10*3/uL (ref 0.1–1.0)
Monocytes Relative: 2 %
Neutro Abs: 35.7 10*3/uL — ABNORMAL HIGH (ref 1.7–7.7)
Neutrophils Relative %: 92 %
Platelets: 270 10*3/uL (ref 150–400)
RBC: 3.45 MIL/uL — ABNORMAL LOW (ref 3.87–5.11)
RDW: 14.4 % (ref 11.5–15.5)
Smear Review: NORMAL
WBC Morphology: INCREASED
WBC: 38.7 10*3/uL — ABNORMAL HIGH (ref 4.0–10.5)
nRBC: 0 % (ref 0.0–0.2)

## 2023-09-14 LAB — CORTISOL: Cortisol, Plasma: 13.6 ug/dL

## 2023-09-14 LAB — MRSA NEXT GEN BY PCR, NASAL: MRSA by PCR Next Gen: DETECTED — AB

## 2023-09-14 LAB — LACTIC ACID, PLASMA
Lactic Acid, Venous: 2.6 mmol/L (ref 0.5–1.9)
Lactic Acid, Venous: 2.6 mmol/L (ref 0.5–1.9)
Lactic Acid, Venous: 2.1 mmol/L (ref 0.5–1.9)

## 2023-09-14 LAB — I-STAT CG4 LACTIC ACID, ED
Lactic Acid, Venous: 2.8 mmol/L (ref 0.5–1.9)
Lactic Acid, Venous: 4.7 mmol/L (ref 0.5–1.9)

## 2023-09-14 LAB — CK: Total CK: 112 U/L (ref 38–234)

## 2023-09-14 LAB — MAGNESIUM: Magnesium: 1.2 mg/dL — ABNORMAL LOW (ref 1.7–2.4)

## 2023-09-14 LAB — HIV ANTIBODY (ROUTINE TESTING W REFLEX): HIV Screen 4th Generation wRfx: NONREACTIVE

## 2023-09-14 LAB — PHOSPHORUS: Phosphorus: 1.7 mg/dL — ABNORMAL LOW (ref 2.5–4.6)

## 2023-09-14 LAB — GLUCOSE, CAPILLARY: Glucose-Capillary: 99 mg/dL (ref 70–99)

## 2023-09-14 MED ORDER — MUPIROCIN 2 % EX OINT
1.0000 | TOPICAL_OINTMENT | Freq: Two times a day (BID) | CUTANEOUS | Status: DC
  Administered 2023-09-14 – 2023-09-18 (×9): 1 via NASAL
  Filled 2023-09-14 (×2): qty 22

## 2023-09-14 MED ORDER — LACTATED RINGERS IV BOLUS: 1000.0000 mL | Freq: Once | INTRAVENOUS | Status: DC

## 2023-09-14 MED ORDER — LACTATED RINGERS IV BOLUS
1000.0000 mL | Freq: Once | INTRAVENOUS | Status: AC
  Administered 2023-09-14: 1000 mL via INTRAVENOUS

## 2023-09-14 MED ORDER — LINEZOLID 600 MG PO TABS
600.0000 mg | ORAL_TABLET | Freq: Two times a day (BID) | ORAL | Status: DC
  Administered 2023-09-14 – 2023-09-18 (×9): 600 mg via ORAL
  Filled 2023-09-14 (×11): qty 1

## 2023-09-14 MED ORDER — FENTANYL CITRATE PF 50 MCG/ML IJ SOSY
12.5000 ug | PREFILLED_SYRINGE | INTRAMUSCULAR | Status: DC | PRN
  Administered 2023-09-14 (×2): 12.5 ug via INTRAVENOUS
  Filled 2023-09-14 (×2): qty 1

## 2023-09-14 MED ORDER — LACTATED RINGERS BOLUS PEDS: 1000.0000 mL | Freq: Once | INTRAVENOUS | Status: DC

## 2023-09-14 MED ORDER — LACTATED RINGERS IV BOLUS
1000.0000 mL | INTRAVENOUS | Status: AC
  Administered 2023-09-14: 1000 mL via INTRAVENOUS

## 2023-09-14 MED ORDER — NYSTATIN 100000 UNIT/ML MT SUSP
5.0000 mL | Freq: Four times a day (QID) | OROMUCOSAL | Status: DC
  Administered 2023-09-14 – 2023-09-17 (×12): 500000 [IU] via ORAL
  Filled 2023-09-14 (×15): qty 5

## 2023-09-14 MED ORDER — FENTANYL CITRATE PF 50 MCG/ML IJ SOSY
25.0000 ug | PREFILLED_SYRINGE | INTRAMUSCULAR | Status: DC | PRN
  Administered 2023-09-15 (×3): 25 ug via INTRAVENOUS
  Filled 2023-09-14 (×3): qty 1

## 2023-09-14 MED ORDER — DIPHENHYDRAMINE HCL 50 MG/ML IJ SOLN: 25.0000 mg | Freq: Four times a day (QID) | INTRAMUSCULAR | Status: DC | PRN

## 2023-09-14 MED ORDER — POTASSIUM PHOSPHATES 15 MMOLE/5ML IV SOLN
30.0000 mmol | Freq: Once | INTRAVENOUS | Status: AC
  Administered 2023-09-14: 30 mmol via INTRAVENOUS
  Filled 2023-09-14: qty 10

## 2023-09-14 MED ORDER — IOHEXOL 350 MG/ML SOLN
75.0000 mL | Freq: Once | INTRAVENOUS | Status: AC | PRN
  Administered 2023-09-14: 75 mL via INTRAVENOUS

## 2023-09-14 MED ORDER — ORAL CARE MOUTH RINSE: 15.0000 mL | OROMUCOSAL | Status: DC | PRN

## 2023-09-14 MED ORDER — PHENYLEPHRINE HCL-NACL 20-0.9 MG/250ML-% IV SOLN
0.0000 ug/min | INTRAVENOUS | Status: DC
  Administered 2023-09-14: 20 ug/min via INTRAVENOUS
  Administered 2023-09-14: 30 ug/min via INTRAVENOUS
  Filled 2023-09-14 (×2): qty 250

## 2023-09-14 MED ORDER — CHLORHEXIDINE GLUCONATE CLOTH 2 % EX PADS
6.0000 | MEDICATED_PAD | Freq: Every day | CUTANEOUS | Status: DC
  Administered 2023-09-14 – 2023-09-16 (×2): 6 via TOPICAL

## 2023-09-14 MED ORDER — SODIUM BICARBONATE 8.4 % IV SOLN
100.0000 meq | Freq: Once | INTRAVENOUS | Status: DC
  Filled 2023-09-14: qty 50

## 2023-09-14 MED ORDER — SILVER SULFADIAZINE 1 % EX CREA
TOPICAL_CREAM | Freq: Every day | CUTANEOUS | Status: DC
  Filled 2023-09-14 (×2): qty 85

## 2023-09-14 MED ORDER — DIPHENHYDRAMINE HCL 50 MG/ML IJ SOLN: 50.0000 mg | Freq: Four times a day (QID) | INTRAMUSCULAR | Status: DC | PRN

## 2023-09-14 MED ORDER — CLINDAMYCIN PHOSPHATE 300 MG/50ML IV SOLN
300.0000 mg | Freq: Four times a day (QID) | INTRAVENOUS | Status: DC
  Administered 2023-09-14: 300 mg via INTRAVENOUS
  Filled 2023-09-14 (×2): qty 50

## 2023-09-14 MED ORDER — MAGNESIUM SULFATE 4 GM/100ML IV SOLN
4.0000 g | Freq: Once | INTRAVENOUS | Status: AC
  Administered 2023-09-14: 4 g via INTRAVENOUS
  Filled 2023-09-14: qty 100

## 2023-09-14 MED ORDER — DIPHENHYDRAMINE HCL 50 MG/ML IJ SOLN
25.0000 mg | Freq: Four times a day (QID) | INTRAMUSCULAR | Status: DC | PRN
  Administered 2023-09-14: 25 mg via INTRAVENOUS
  Filled 2023-09-14: qty 1

## 2023-09-14 NOTE — Care Management (Signed)
 Transition of Care [facility]) - Inpatient Brief Assessment   Patient Details  Name: Julie Rubio MRN: [id number] Date of Birth: 02-02-2006  [facility] CM/SW Contact:    Corean JAYSON Canary, RN Phone Number: 09/14/2023, 2:45 PM   Clinical Narrative:  18 year old presented with cellulitis around site of new tattoo, left upper thigh. WBC 38.3 started on IV abx, meets SIRS criteria. Patient has PCP and insurance. NO needs identified at this time. The patient will be discussed in daily progressive rounds, if a needs is identified , please place a TOC consult.  Transition of Care Asessment: Insurance and Status: Insurance coverage has been reviewed Patient has primary care physician: Yes Home environment has been reviewed: Apartment Prior level of function:: Independent Prior/Current Home Services: No current home services Social Drivers of Health Review: SDOH reviewed no interventions necessary Readmission risk has been reviewed: Yes Transition of care needs: no transition of care needs at this time

## 2023-09-14 NOTE — Sepsis Progress Note (Signed)
 Notified bedside nurse of need to draw repeat lactic acid.

## 2023-09-14 NOTE — Progress Notes (Addendum)
 eLink Physician-Brief Progress Note Patient Name: Julie Rubio DOB: [birth year] MRN: [id number]   Date of Service  09/14/2023  HPI/Events of Note  Asking for something to put on her tattoo/area of cellulitis  eICU Interventions  Add Silvadene  topical treatment   0211 - Refusing enoxaparin , but ambulating in room. DC Lovenox .  Intervention Category Minor Interventions: Routine modifications to care plan (e.g. PRN medications for pain, fever)  Julie Rubio 09/14/2023, 11:16 PM

## 2023-09-14 NOTE — Progress Notes (Addendum)
  Patient is complaining about itchiness of face and neck.  Physical exam did not showed any evidence of hives and rash.   Mother at the bedside reported that the patient does not have any history of allergic reaction to penicillin or any antibiotic before.   However patient's grandmother has history of allergy to morphine  and due to the pain patient received morphine  2 mg earlier.  Changing morphine  to fentanyl .  Continue Benadryl  as needed for itchiness and allergy. - Continue Zosyn  and vancomycin .  However need to continue patient's vital very closely, pulse ox and oxygen saturation.  Informed patient nurse and patient's mother at the bedside if patient complaining about any sensation of throat closure or patient develop any respiratory distress in that case need to inform MD immediately.  Also patient is tachycardic heart rate 137 and MAP around 64-65.  Status post 1 L of LR bolus and currently getting LR 150 cc/h and broad-spectrum antibiotic coverage with IV vancomycin  and Zosyn .  Patient's blood pressure and heart rates refractory to fluid concern for development of septic shock.  Consulting ICU for evaluation and recommendation.  Marthann Abshier, MD [facility] 09/14/2023, 5:00 AM    Update, patient is persistently hypotensive and tachycardic refractory to IV fluid.  I see patient Dr. Noreen giving fourth bolus of LR and started phenylephrine  pressor and patient will be transferred to ICU.

## 2023-09-14 NOTE — Plan of Care (Signed)

## 2023-09-14 NOTE — Consult Note (Signed)
 NAME:  Julie Rubio, MRN:  [id number], DOB:  10/25/2005, LOS: 1 ADMISSION DATE:  09/13/2023, CONSULTATION DATE:  09/14/2023 REFERRING MD:  Dr. Subrina Sundil, CHIEF COMPLAINT:  Septic Shock   History of Present Illness:  18 year old female presenting to the emergency department on 1/8.  Patient reportedly got a tattoo for her birthday on her left thigh and then on 1/7 began to experience tenderness that progressively worsened.  Patient then developed nausea with subsequent emesis later on in the day.  She had a fever up to 103 F at home which she treated with Tylenol .  She reports mild subjective fever that is ongoing but denies any chills or sweats.  She reports her chest intermittently feels tight but denies any frank chest pain.  She reports mild shortness of breath but no new cough or wheezing.  She denies any dysphagia or odynophagia but has notably had decreased p.o. intake for days now.  In the emergency department the patient received Rocephin  initially and then was subsequently transition to IV vancomycin  and Zosyn .  She was noted to have a lactic acidosis as well.  She received bolus IV fluid but with increasing lactic acid and worsening hypotension critical care consult was requested.  Pertinent  Medical History  N/A  Significant Hospital Events: Including procedures, antibiotic start and stop dates in addition to other pertinent events   01/08 - Admitted by Hospitalist 01/09 - PCCM consult with septic shock & itching  Interim History / Subjective:  N/A  Objective   Blood pressure 90/64, pulse (!) 135, temperature (!) 101.3 F (38.5 C), temperature source Oral, resp. rate (!) 27, SpO2 100%.        Intake/Output Summary (Last 24 hours) at 09/14/2023 0711 Last data filed at 09/14/2023 0553 Gross per 24 hour  Intake 2841.57 ml  Output --  Net 2841.57 ml   There were no vitals filed for this visit.  Examination: General:  No acute distress.  Mother at bedside. Alert and  oriented.  Young female. Integument:  Warm & dry.  Mild erythema and induration overlying the central aspect of the patient's tattoo on her left thigh.  Extremities:  No cyanosis or clubbing.  Lymphatics:  No appreciated cervical or supraclavicular lymphadenopathy. HEENT:  Moist mucus membranes. No oral ulcers. No scleral icterus.  Oral thrush is present on tongue. Cardiovascular: Tachycardia cardiac with regular rhythm. Sinus tachycardia on telemetry. No edema or JVD appreciated. No murmur appreciated. Pulmonary:  Clear breath sounds bilaterally. Symmetric chest wall rise.  Good aeration bilaterally. Abdomen: Soft. Nondistended. Hypoactive bowel sounds.  Nontender. Musculoskeletal:  Normal bulk and tone. No joint deformity or effusion appreciated. Neurological: Cranial nerves II through XII intact.  Moving all 4 extremities equally.  No meningismus. Psychiatric: Affect congruent.  Speech normal rhythm, rate, and tone.  Oriented x 4.  In  [facility] Problem list   N/A  Assessment & Plan:  18 year old female with septic shock secondary to cellulitis of the left thigh.  There is no clear evidence for necrotizing fasciitis or an abscess on CT imaging.  Patient has received bolus IV fluid but still has hypotension.  Patient's persistently elevated lactic acid is likely due to poor perfusion in the setting of her sepsis.    1.  Septic shock: Present on arrival and secondary to left thigh cellulitis.  Checking stat serum cortisol.  Continuing to trend lactic acid.  Blood cultures are pending.  Continuing fluid resuscitation with lactated Ringer 's.  Initiating low-dose peripherally infuse  Neo-Synephrine to maintain mean arterial pressure greater than 65.  Continuing treatment of cellulitis as follows.  2.  Left thigh cellulitis: Blood cultures are pending.  MRSA nasal PCR is pending.  Continuing treatment empirically with vancomycin  and Zosyn .  Adding clindamycin  to patient's antimicrobial  regimen.  3.  Lactic acidosis: Secondary to septic shock and present on arrival.  Continuing to trend lactic acid.  Maintaining normotension with peripheral Neo-Synephrine as needed.  Continuing gentle IV fluid with LR.  4.  Oral thrush: Treating with nystatin  swish and swallow.  HIV negative.  5.  Possible UTI: Present on arrival.  Suspect urinalysis is primarily secondary to acute injury from prerenal state.  Urine culture is pending.  Patient continuing on broad-spectrum antibiotic therapy as per 2.  6.  Hyponatremia: Mild.  Monitoring electrolytes closely.  Utilizing isotonic fluids.  7.  Disposition: Transferring patient to ICU for further treatment and stabilization.  Best Practice (right click and Reselect all SmartList Selections daily)   Diet/type: full liquids  DVT prophylaxis LMWH Pressure ulcer(s): N/A GI prophylaxis: N/A Lines: N/A Foley:  N/A Code Status:  full code Last date of multidisciplinary goals of care discussion [Pending]  Labs   CBC: Recent Labs  Lab 09/13/23 1910 09/14/23 0607  WBC 38.3* 38.7*  NEUTROABS 36.3* 35.7*  HGB 12.2 10.2*  HCT 37.0 29.5*  MCV 87.5 85.5  PLT 365 270    Basic Metabolic Panel: Recent Labs  Lab 09/13/23 1910  NA 133*  K 3.6  CL 99  CO2 16*  GLUCOSE 109*  BUN 14  CREATININE 1.09*  CALCIUM 8.3*   GFR: CrCl cannot be calculated (Unknown ideal weight.). Recent Labs  Lab 09/13/23 1910 09/13/23 1921 09/14/23 0259 09/14/23 0520 09/14/23 0607  WBC 38.3*  --   --   --  38.7*  LATICACIDVEN  --  3.6* 2.8* 4.7*  --     Liver Function Tests: Recent Labs  Lab 09/13/23 1910  AST 24  ALT 13  ALKPHOS 59  BILITOT 0.7  PROT 7.2  ALBUMIN 3.6   No results for input(s): LIPASE, AMYLASE in the last 168 hours. No results for input(s): AMMONIA in the last 168 hours.  ABG No results found for: PHART, PCO2ART, PO2ART, HCO3, TCO2, ACIDBASEDEF, O2SAT   Coagulation Profile: Recent Labs  Lab  09/13/23 1910  INR 1.7*    Cardiac Enzymes: No results for input(s): CKTOTAL, CKMB, CKMBINDEX, TROPONINI in the last 168 hours.  HbA1C: No results found for: HGBA1C  CBG: No results for input(s): GLUCAP in the last 168 hours.  IMAGING: CT LEFT FEMUR W/ CONTRAST 09/14/23 (per radiologist): IMPRESSION: 1. Mild subcutaneous edema in the proximal left thigh with mild skin thickening. No organized fluid collection or abscess. 2. No evidence of deep tissue infection.  Review of Systems:   No dysuria or hematuria.  No headache or acute vision changes.  A pertinent 14 point review of systems is negative except as per the HPI.  Past Medical History:   Past Medical History:  Diagnosis Date   Abdominal migraine    Reflux esophagitis    Solitary kidney, congenital     Surgical History:   Past Surgical History:  Procedure Laterality Date   ESOPHAGOGASTRODUODENOSCOPY ENDOSCOPY       Social History:   reports that she has never smoked. She has never used smokeless tobacco. She reports that she does not drink alcohol and does not use drugs.   Family History:  Her family history includes Raynaud  syndrome in her mother.   Allergies Allergies  Allergen Reactions   Morphine  Itching   Nsaids     Pt has one kidney     Home Medications  Prior to Admission medications   Medication Sig Start Date End Date Taking? Authorizing Provider  norethindrone (MICRONOR) 0.35 MG tablet Take 1 tablet by mouth daily.   Yes [provider]  omeprazole (PRILOSEC) 20 MG capsule Take 20 mg by mouth daily. 01/10/18  Yes [provider]  Phenylephrine -DM-GG-APAP (TYLENOL  COLD MULTI-SYMPTOM DAY) 5-10-200-325 MG/15ML LIQD Take 30 mLs by mouth every 6 (six) hours as needed.   Yes [provider]  sertraline (ZOLOFT) 25 MG tablet Take 25 mg by mouth at bedtime.   Yes [provider]     Critical care time: I have personally spent a total of 36 minutes of  critical care time today managing the patient's septic shock requiring initiation and titration of vasopressor support independent of time spent during procedures, reviewing the patient's electronic medical record, discussing plan of care with staff at bedside, discussing plan of care with patient's mother bedside, and caring for the patient.  The patient is at high risk for further clinical deterioration given her critical illness and comorbid medical conditions.

## 2023-09-14 NOTE — Progress Notes (Signed)
 Afternoon rounds: stable. Neo down to 30 from . Eating. CK not elevated so doubt an underlying compartment syndrome to the left thigh. Repeat lactic is in process in lab. Previous 2.6 x2. On continuous IVF.

## 2023-09-14 NOTE — Progress Notes (Signed)
 Seen this morning on AM rounds.  Young female, admitted for severe sepsis 2/2 cellulitis LUL from tattoo on 1/5. N/V fever at home, mild shortness of breath.  Has received 4L IVF bolus, on continuous LR. On exam she's anxious.  Alert, oriented, non-focal neuro exam. Sinus tachycardia 115. BP systolic 90s.  Abdomen is soft  LUL is red, hot, edematous and tight. Exquisitely TTP  40 neo   Most recent lactic 2.6 WBC 38.7 Hgb 10 Cortisol normal  HIV and hepatitis panel negative  Bcx pending  CT negative for abscess of nec fasc  Adding on CK this AM given how tight her leg is.  Mag 1.2 ->replacing Phos 1.7->replacing Switching antibiotics to linezolid /zosyn    Continue antibiotics, fluids, pressors. Had anticipatory conversation with mother and patient about possible need for a-line if requirements continue to increase. Watch closely.

## 2023-09-15 ENCOUNTER — Inpatient Hospital Stay (HOSPITAL_COMMUNITY): Payer: 59

## 2023-09-15 DIAGNOSIS — A419 Sepsis, unspecified organism: Secondary | ICD-10-CM

## 2023-09-15 DIAGNOSIS — Z1159 Encounter for screening for other viral diseases: Secondary | ICD-10-CM

## 2023-09-15 DIAGNOSIS — L03116 Cellulitis of left lower limb: Secondary | ICD-10-CM | POA: Diagnosis not present

## 2023-09-15 DIAGNOSIS — I38 Endocarditis, valve unspecified: Secondary | ICD-10-CM | POA: Diagnosis not present

## 2023-09-15 DIAGNOSIS — L818 Other specified disorders of pigmentation: Secondary | ICD-10-CM

## 2023-09-15 DIAGNOSIS — Z5181 Encounter for therapeutic drug level monitoring: Secondary | ICD-10-CM

## 2023-09-15 DIAGNOSIS — L0390 Cellulitis, unspecified: Secondary | ICD-10-CM | POA: Diagnosis not present

## 2023-09-15 DIAGNOSIS — R579 Shock, unspecified: Secondary | ICD-10-CM

## 2023-09-15 DIAGNOSIS — B370 Candidal stomatitis: Secondary | ICD-10-CM

## 2023-09-15 DIAGNOSIS — R6521 Severe sepsis with septic shock: Secondary | ICD-10-CM | POA: Diagnosis not present

## 2023-09-15 LAB — ECHOCARDIOGRAM COMPLETE
AR max vel: 2.57 cm2
AV Area VTI: 2.21 cm2
AV Area mean vel: 2.29 cm2
AV Mean grad: 3 mm[Hg]
AV Peak grad: 6 mm[Hg]
Ao pk vel: 1.22 m/s
Area-P 1/2: 6.27 cm2
Height: 64 in
S' Lateral: 3 cm
Weight: 1922.41 [oz_av]

## 2023-09-15 LAB — CBC WITH DIFFERENTIAL/PLATELET
Abs Immature Granulocytes: 0 10*3/uL (ref 0.00–0.07)
Basophils Absolute: 0 10*3/uL (ref 0.0–0.1)
Basophils Relative: 0 %
Eosinophils Absolute: 0.3 10*3/uL (ref 0.0–0.5)
Eosinophils Relative: 1 %
HCT: 29.1 % — ABNORMAL LOW (ref 36.0–46.0)
Hemoglobin: 9.9 g/dL — ABNORMAL LOW (ref 12.0–15.0)
Lymphocytes Relative: 4 %
Lymphs Abs: 1.1 10*3/uL (ref 0.7–4.0)
MCH: 28.8 pg (ref 26.0–34.0)
MCHC: 34 g/dL (ref 30.0–36.0)
MCV: 84.6 fL (ref 80.0–100.0)
Monocytes Absolute: 0.6 10*3/uL (ref 0.1–1.0)
Monocytes Relative: 2 %
Neutro Abs: 26.6 10*3/uL — ABNORMAL HIGH (ref 1.7–7.7)
Neutrophils Relative %: 93 %
Platelets: 277 10*3/uL (ref 150–400)
RBC: 3.44 MIL/uL — ABNORMAL LOW (ref 3.87–5.11)
RDW: 14.4 % (ref 11.5–15.5)
WBC: 28.6 10*3/uL — ABNORMAL HIGH (ref 4.0–10.5)
nRBC: 0 % (ref 0.0–0.2)
nRBC: 0 /100{WBCs}

## 2023-09-15 LAB — COMPREHENSIVE METABOLIC PANEL
ALT: 14 U/L (ref 0–44)
AST: 24 U/L (ref 15–41)
Albumin: 2.3 g/dL — ABNORMAL LOW (ref 3.5–5.0)
Alkaline Phosphatase: 66 U/L (ref 38–126)
Anion gap: 10 (ref 5–15)
BUN: 8 mg/dL (ref 6–20)
CO2: 19 mmol/L — ABNORMAL LOW (ref 22–32)
Calcium: 8 mg/dL — ABNORMAL LOW (ref 8.9–10.3)
Chloride: 105 mmol/L (ref 98–111)
Creatinine, Ser: 0.99 mg/dL (ref 0.44–1.00)
GFR, Estimated: >60 mL/min (ref >60)
Glucose, Bld: 98 mg/dL (ref 70–99)
Potassium: 3.8 mmol/L (ref 3.5–5.1)
Sodium: 134 mmol/L — ABNORMAL LOW (ref 135–145)
Total Bilirubin: 0.5 mg/dL (ref 0.0–1.2)
Total Protein: 4.8 g/dL — ABNORMAL LOW (ref 6.5–8.1)

## 2023-09-15 LAB — MAGNESIUM: Magnesium: 2 mg/dL (ref 1.7–2.4)

## 2023-09-15 LAB — PHOSPHORUS: Phosphorus: 2.3 mg/dL — ABNORMAL LOW (ref 2.5–4.6)

## 2023-09-15 MED ORDER — POTASSIUM PHOSPHATES 15 MMOLE/5ML IV SOLN
15.0000 mmol | Freq: Once | INTRAVENOUS | Status: AC
  Administered 2023-09-15: 15 mmol via INTRAVENOUS
  Filled 2023-09-15: qty 5

## 2023-09-15 MED ORDER — MIDODRINE HCL 5 MG PO TABS
10.0000 mg | ORAL_TABLET | Freq: Three times a day (TID) | ORAL | Status: DC
  Administered 2023-09-15 – 2023-09-18 (×8): 10 mg via ORAL
  Filled 2023-09-15 (×10): qty 2

## 2023-09-15 MED ORDER — SODIUM CHLORIDE 0.9 % IV SOLN
3.0000 g | Freq: Four times a day (QID) | INTRAVENOUS | Status: DC
  Administered 2023-09-16 – 2023-09-18 (×9): 3 g via INTRAVENOUS
  Filled 2023-09-15 (×10): qty 8

## 2023-09-15 MED ORDER — OXYCODONE-ACETAMINOPHEN 5-325 MG PO TABS
1.0000 | ORAL_TABLET | ORAL | Status: DC | PRN
Start: 2023-09-15 — End: 2023-09-18
  Administered 2023-09-15: 1 via ORAL
  Administered 2023-09-16 – 2023-09-17 (×2): 2 via ORAL
  Filled 2023-09-15: qty 2
  Filled 2023-09-15: qty 1
  Filled 2023-09-15 (×4): qty 2

## 2023-09-15 NOTE — Progress Notes (Addendum)
 NAME:  Julie Rubio, MRN:  [id number], DOB:  [birth year], LOS: 2 ADMISSION DATE:  09/13/2023, CONSULTATION DATE:  09/14/2023 REFERRING MD:  Sundil, TRH, CHIEF COMPLAINT:  septic shock   History of Present Illness:  18 year old female presenting to the emergency department on 1/8. Patient reportedly got a tattoo for her birthday on her left thigh and then on 1/7 began to experience tenderness that progressively worsened. Patient then developed nausea with subsequent emesis later on in the day. She had a fever up to 103 F at home which she treated with Tylenol . She reports mild subjective fever that is ongoing but denies any chills or sweats. She reports her chest intermittently feels tight but denies any frank chest pain. She reports mild shortness of breath but no new cough or wheezing. She denies any dysphagia or odynophagia but has notably had decreased p.o. intake for days now. In the emergency department the patient received Rocephin  initially and then was subsequently transition to IV vancomycin  and Zosyn . She was noted to have a lactic acidosis as well. She received bolus IV fluid but with increasing lactic acid and worsening hypotension critical care consult was requested.   Pertinent  Medical History  none  Significant Hospital Events: Including procedures, antibiotic start and stop dates in addition to other pertinent events   1/8: admit by hospitalist  1/9: pccm consult for septic shock 1/10: neo off, urine + s. Aureus but bcx negative. Concern for seed from blood so redrawing cultures. ID consulted for help with antibiotic management/duration.   Interim History / Subjective:  Patient is having ongoing pain to LUL with any movement or examination. She is tired.   Objective   Blood pressure (!) 103/47, pulse (!) 113, temperature 99.5 F (37.5 C), temperature source Oral, resp. rate (!) 26, height 5' 4 (1.626 m), weight 54.5 kg, SpO2 97%.        Intake/Output Summary (Last 24  hours) at 09/15/2023 0815 Last data filed at 09/15/2023 0400 Gross per 24 hour  Intake 5360.07 ml  Output 4005 ml  Net 1355.07 ml   Filed Weights   09/14/23 0700 09/15/23 0500  Weight: 54.5 kg 54.5 kg    Examination: General: young female laying in bed, pain to leg, tired but not in distress HENT: [place]/AT, anicteric sclera, mucous membranes moist Lungs: clear bilaterally, room air  Cardiovascular: s1s2, rrr, no murmur, rub, gallop Abdomen: flat, soft, non-tender Extremities: LUL erythematous, hot, TTP, no purulence  Neuro: awake, alert, oriented, non focal exam  GU: no foley  Resolved Hospital Problem list   Shock   Assessment & Plan:  Sepsis 2/2 left thigh cellulitis after tattoo. Lactic trending down. Blood cultures ngtd. Urine cx oddly grew s.aureus and concern for seeding from blood. Initially on neo after sufficient fluid volume resuscitation.  - redraw blood cultures  - con't linezolid  - d/c zosyn , +unasyn   - f/u TTE - consider MRI for worsening  - ID following, appreciate recs as above - con't midodrine  10mg  TID  - pain control with fentanyl  prn (itching with morphine )   Oral thrush; HIV negative  - nystatin  swish and spit  - resolving   ?UTI ; urine culture grew s.aureus  - con't linezolid     Best Practice (right click and Reselect all SmartList Selections daily)   Diet/type: Regular consistency (see orders) DVT prophylaxis: SCD Pressure ulcer(s): na GI prophylaxis: N/A Lines: N/A Foley:  N/A Code Status:  full code Last date of multidisciplinary goals of care discussion [updated  at bedside]  Labs   CBC: Recent Labs  Lab 09/13/23 1910 09/14/23 0607 09/15/23 0335  WBC 38.3* 38.7* 28.6*  NEUTROABS 36.3* 35.7* 26.6*  HGB 12.2 10.2* 9.9*  HCT 37.0 29.5* 29.1*  MCV 87.5 85.5 84.6  PLT 365 270 277    Basic Metabolic Panel: Recent Labs  Lab 09/13/23 1910 09/14/23 0607 09/15/23 0334  NA 133* 131* 134*  K 3.6 3.9 3.8  CL 99 103 105  CO2 16*  19* 19*  GLUCOSE 109* 99 98  BUN 14 12 8   CREATININE 1.09* 1.05* 0.99  CALCIUM 8.3* 7.3* 8.0*  MG  --  1.2* 2.0  PHOS  --  1.7* 2.3*   GFR: Estimated Creatinine Clearance: 78 mL/min (by C-G formula based on SCr of 0.99 mg/dL). Recent Labs  Lab 09/13/23 1910 09/13/23 1921 09/14/23 0520 09/14/23 0607 09/14/23 0920 09/14/23 1506 09/15/23 0335  WBC 38.3*  --   --  38.7*  --   --  28.6*  LATICACIDVEN  --    < > 4.7* 2.6* 2.6* 2.1*  --    < > = values in this interval not displayed.    Liver Function Tests: Recent Labs  Lab 09/13/23 1910 09/14/23 0607 09/15/23 0334  AST 24 20 24   ALT 13 11 14   ALKPHOS 59 52 66  BILITOT 0.7 0.4 0.5  PROT 7.2 5.0* 4.8*  ALBUMIN 3.6 2.5* 2.3*   No results for input(s): LIPASE, AMYLASE in the last 168 hours. No results for input(s): AMMONIA in the last 168 hours.  ABG No results found for: PHART, PCO2ART, PO2ART, HCO3, TCO2, ACIDBASEDEF, O2SAT   Coagulation Profile: Recent Labs  Lab 09/13/23 1910  INR 1.7*    Cardiac Enzymes: Recent Labs  Lab 09/14/23 0920  CKTOTAL 112    HbA1C: No results found for: HGBA1C  CBG: Recent Labs  Lab 09/14/23 0717  GLUCAP 99    Review of Systems:   As above  Past Medical History:  She,  has a past medical history of Abdominal migraine, Reflux esophagitis, and Solitary kidney, congenital.   Surgical History:   Past Surgical History:  Procedure Laterality Date   ESOPHAGOGASTRODUODENOSCOPY ENDOSCOPY       Social History:   reports that she has never smoked. She has never used smokeless tobacco. She reports that she does not drink alcohol and does not use drugs.   Family History:  Her family history includes Raynaud syndrome in her mother.   Allergies Allergies  Allergen Reactions   Morphine  Itching   Nsaids     Pt has one kidney     Home Medications  Prior to Admission medications   Medication Sig Start Date End Date Taking? Authorizing Provider   norethindrone (MICRONOR) 0.35 MG tablet Take 1 tablet by mouth daily.   Yes [provider]  omeprazole (PRILOSEC) 20 MG capsule Take 20 mg by mouth daily. 01/10/18  Yes [provider]  Phenylephrine -DM-GG-APAP (TYLENOL  COLD MULTI-SYMPTOM DAY) 5-10-200-325 MG/15ML LIQD Take 30 mLs by mouth every 6 (six) hours as needed.   Yes [provider]  sertraline (ZOLOFT) 25 MG tablet Take 25 mg by mouth at bedtime.   Yes [provider]     Critical care time: 41   Tinnie FORBES Adolph DEVONNA [facility] 09/15/23 2:25 PM  Please see [email or web address] for pager details.  From 7A-7P if no response, please call [phone] After hours, please call ELink [phone]

## 2023-09-15 NOTE — Plan of Care (Signed)
°  Problem: Education: Goal: Knowledge of General Education information will improve Description: Including pain rating scale, medication(s)/side effects and non-pharmacologic comfort measures Outcome: Progressing   Problem: Activity: Goal: Risk for activity intolerance will decrease Outcome: Progressing   Problem: Nutrition: Goal: Adequate nutrition will be maintained Outcome: Progressing   Problem: Elimination: Goal: Will not experience complications related to bowel motility Outcome: Progressing   Problem: Safety: Goal: Ability to remain free from injury will improve Outcome: Progressing   Problem: Skin Integrity: Goal: Risk for impaired skin integrity will decrease Outcome: Progressing   

## 2023-09-15 NOTE — Plan of Care (Signed)

## 2023-09-15 NOTE — Progress Notes (Signed)
 Attempted to run 1800 Unasyn  via PIV.  Pt screamed in pain after trying to run in either PIV.  Attempted to flush LPIV, pt cried and told me to stop. PIV did flush well.  Pt refused flushing any PIV at this point.  Placed IV consult stat.  Pt still needs 1800 antibiotics.

## 2023-09-15 NOTE — Consult Note (Signed)
 [facility]    Date of Admission:  09/13/2023     Total days of antibiotics 3               Reason for Consult: Sepsis / Cellulitis  Referring Provider: Dr. Merrie Primary Care Provider: [facility] [place]   ASSESSMENT:  Julie Rubio is a 18 y/o AA female with recent tattoo presenting to the ED with fevers, body aches, chills, and nausea/vomiting and found to have cellulitis and hypotension. Now off vasopressors. WBC count is down trending. No deep tissue infection or fluid collection noted on CT however ideally would be able to get MRI to confirm this. With WBC count coming down okay to wait on MRI and continue with antibiotics which we will narrow to Unasyn  and Zyvox . Unclear as to Staphylococcus aureus in her urine which may represent bacteremia at one point, however she has no current urinary symptoms. Will check TTE. Plan of care and recommendations discussed with mother at bedside. Remaining medical and supportive care per PCCM.   PLAN:  Continue current dose of linezolid  and Unasyn .  Monitor blood cultures for bacteremia.  Check TTE. Consider MRI imaging for any worsening to ensure no surgical intervention needed. Remaining medical and supportive care per PCCM.    Principal Problem:   Sepsis due to cellulitis [facility]) Active Problems:   Cellulitis of left thigh   AKI (acute kidney injury) (HCC)   Acute cystitis   High anion gap metabolic acidosis   Severe sepsis (HCC)   Hyponatremia    Chlorhexidine  Gluconate Cloth  6 each Topical Daily   linezolid   600 mg Oral Q12H   midodrine   10 mg Oral TID WC   mupirocin  ointment  1 Application Nasal BID   nystatin   5 mL Oral QID   silver  sulfADIAZINE    Topical Daily   sodium chloride  flush  3 mL Intravenous Q12H     HPI: Julie Rubio is a 18 y.o. female with previous medical history of congenital solitary kidney and gastroesophageal reflux presenting with swelling and  drainage from a new tattoo.   Julie Rubio received a new tattoo to her left thigh and on 1/7 began having swelling, drainage, fever (103 F at home) and body aches. Febrile shortly after admission with a WBC count of 38.7.  CT left femur with subcutaneous edema with no fluid collection or abscess. Started on broad spectrum antibiotics with vancomycin  and piperacillin -tazobactam. Hypotensive in the ED and admitted to the ICU. Blood cultures have been without growth to date. Urine culture showing Staphylococcus aureus. Clindamycin  was added and then changed to linezolid .   Julie Rubio mother is present at bedside. She has significant pain over the site of her tattoo and is severely tender to the point the bed sheet is causing discomfort. Feeling a little better since admission. Previous bad experience with getting an MRI. No other new pain or areas of concern.    Review of Systems: Review of Systems  Constitutional:  Negative for chills, fever and weight loss.  Respiratory:  Negative for cough, shortness of breath and wheezing.   Cardiovascular:  Negative for chest pain and leg swelling.  Gastrointestinal:  Negative for abdominal pain, constipation, diarrhea, nausea and vomiting.  Musculoskeletal:        Left thigh pain.  Skin:  Negative for rash.     Past Medical History:  Diagnosis Date   Abdominal migraine    Reflux esophagitis  Solitary kidney, congenital     Social History   Tobacco Use   Smoking status: Never   Smokeless tobacco: Never  Vaping Use   Vaping status: Never Used  Substance Use Topics   Alcohol use: Never   Drug use: Never    Family History  Problem Relation Age of Onset   Raynaud syndrome Mother     Allergies  Allergen Reactions   Morphine  Itching   Nsaids     Pt has one kidney    OBJECTIVE: Blood pressure (!) 98/44, pulse (!) 112, temperature 99.4 F (37.4 C), temperature source Oral, resp. rate (!) 27, height 5' 4 (1.626 m), weight 54.5 kg, SpO2  96%.  Physical Exam Constitutional:      General: She is not in acute distress.    Appearance: She is well-developed.  Cardiovascular:     Rate and Rhythm: Normal rate and regular rhythm.     Heart sounds: Normal heart sounds.  Pulmonary:     Effort: Pulmonary effort is normal.     Breath sounds: Normal breath sounds.  Skin:    General: Skin is warm and dry.     Comments: Left thigh with redness around tattoo and severely tender to the touch.   Neurological:     Mental Status: She is alert and oriented to person, place, and time.  Psychiatric:        Mood and Affect: Mood normal.     Lab Results Lab Results  Component Value Date   WBC 28.6 (H) 09/15/2023   HGB 9.9 (L) 09/15/2023   HCT 29.1 (L) 09/15/2023   MCV 84.6 09/15/2023   PLT 277 09/15/2023    Lab Results  Component Value Date   CREATININE 0.99 09/15/2023   BUN 8 09/15/2023   NA 134 (L) 09/15/2023   K 3.8 09/15/2023   CL 105 09/15/2023   CO2 19 (L) 09/15/2023    Lab Results  Component Value Date   ALT 14 09/15/2023   AST 24 09/15/2023   ALKPHOS 66 09/15/2023   BILITOT 0.5 09/15/2023     Microbiology: Recent Results (from the past 240 hours)  Resp panel by RT-PCR (RSV, Flu A&B, Covid) Anterior Nasal Swab     Status: None   Collection Time: 09/13/23  7:10 PM   Specimen: Anterior Nasal Swab  Result Value Ref Range Status   SARS Coronavirus 2 by RT PCR NEGATIVE NEGATIVE Final   Influenza A by PCR NEGATIVE NEGATIVE Final   Influenza B by PCR NEGATIVE NEGATIVE Final    Comment: (NOTE) The Xpert Xpress SARS-CoV-2/FLU/RSV plus assay is intended as an aid in the diagnosis of influenza from Nasopharyngeal swab specimens and should not be used as a sole basis for treatment. Nasal washings and aspirates are unacceptable for Xpert Xpress SARS-CoV-2/FLU/RSV testing.  Fact Sheet for Patients: bloggercourse.com  Fact Sheet for Healthcare  Providers: seriousbroker.it  This test is not yet approved or cleared by the [place]  FDA and has been authorized for detection and/or diagnosis of SARS-CoV-2 by FDA under an Emergency Use Authorization (EUA). This EUA will remain in effect (meaning this test can be used) for the duration of the COVID-19 declaration under Section 564(b)(1) of the Act, 21 U.S.C. section 360bbb-3(b)(1), unless the authorization is terminated or revoked.     Resp Syncytial Virus by PCR NEGATIVE NEGATIVE Final    Comment: (NOTE) Fact Sheet for Patients: bloggercourse.com  Fact Sheet for Healthcare Providers: seriousbroker.it  This test is not yet approved  or cleared by the [place]  FDA and has been authorized for detection and/or diagnosis of SARS-CoV-2 by FDA under an Emergency Use Authorization (EUA). This EUA will remain in effect (meaning this test can be used) for the duration of the COVID-19 declaration under Section 564(b)(1) of the Act, 21 U.S.C. section 360bbb-3(b)(1), unless the authorization is terminated or revoked.  Performed at [facility], [address], [place], [place] [address]   Blood Culture (routine x 2)     Status: None (Preliminary result)   Collection Time: 09/13/23  7:10 PM   Specimen: BLOOD LEFT FOREARM  Result Value Ref Range Status   Specimen Description BLOOD LEFT FOREARM  Final   Special Requests   Final    BOTTLES DRAWN AEROBIC AND ANAEROBIC Blood Culture results may not be optimal due to an inadequate volume of blood received in culture bottles   Culture   Final    NO GROWTH 2 DAYS Performed at [facility], [address] [place], [place] [address]    Report Status PENDING  Incomplete  Blood Culture (routine x 2)     Status: None (Preliminary result)   Collection Time: 09/13/23  7:40 PM   Specimen: BLOOD  Result Value Ref Range Status   Specimen Description  BLOOD RIGHT ANTECUBITAL  Final   Special Requests   Final    BOTTLES DRAWN AEROBIC AND ANAEROBIC Blood Culture results may not be optimal due to an inadequate volume of blood received in culture bottles   Culture   Final    NO GROWTH 2 DAYS Performed at [facility], [address], [place], [place] [address]    Report Status PENDING  Incomplete  Urine Culture     Status: Abnormal (Preliminary result)   Collection Time: 09/14/23  4:15 AM   Specimen: Urine, Clean Catch  Result Value Ref Range Status   Specimen Description URINE, CLEAN CATCH  Final   Special Requests NONE  Final   Culture (A)  Final    >=100,000 COLONIES/mL STAPHYLOCOCCUS AUREUS SUSCEPTIBILITIES TO FOLLOW Performed at [facility], [address] [place], [place] [address]    Report Status PENDING  Incomplete  MRSA Next Gen by PCR, Nasal     Status: Abnormal   Collection Time: 09/14/23  7:07 AM   Specimen: Nasal Mucosa; Nasal Swab  Result Value Ref Range Status   MRSA by PCR Next Gen DETECTED (A) NOT DETECTED Final    Comment: RESULT CALLED TO, READ BACK BY AND VERIFIED WITH: RN FELICIA on [id number] @0944  by SM (NOTE) The GeneXpert MRSA Assay (FDA approved for NASAL specimens only), is one component of a comprehensive MRSA colonization surveillance program. It is not intended to diagnose MRSA infection nor to guide or monitor treatment for MRSA infections. Test performance is not FDA approved in patients less than 34 years old. Performed at [facility], [address] [place], [place] [address]      Cathlyn July, NP [facility]  09/15/2023  1:03 PM

## 2023-09-15 NOTE — Plan of Care (Signed)
  Problem: Education: Goal: Knowledge of General Education information will improve Description: Including pain rating scale, medication(s)/side effects and non-pharmacologic comfort measures 09/15/2023 2021 by Maili Shutters K, RN Outcome: Progressing 09/15/2023 2020 by Jenesa Foresta K, RN Outcome: Progressing   Problem: Health Behavior/Discharge Planning: Goal: Ability to manage health-related needs will improve 09/15/2023 2021 by Anthonette Lesage K, RN Outcome: Progressing 09/15/2023 2020 by Sarahbeth Cashin K, RN Outcome: Progressing   Problem: Clinical Measurements: Goal: Ability to maintain clinical measurements within normal limits will improve 09/15/2023 2021 by Mickenzie Stolar K, RN Outcome: Progressing 09/15/2023 2020 by Taura Lamarre K, RN Outcome: Progressing

## 2023-09-15 NOTE — Plan of Care (Signed)

## 2023-09-15 NOTE — Progress Notes (Signed)
  Echocardiogram 2D Echocardiogram has been performed.  Julie Rubio 09/15/2023, 11:33 AM

## 2023-09-16 DIAGNOSIS — L0390 Cellulitis, unspecified: Secondary | ICD-10-CM | POA: Diagnosis not present

## 2023-09-16 DIAGNOSIS — L03116 Cellulitis of left lower limb: Secondary | ICD-10-CM | POA: Diagnosis not present

## 2023-09-16 DIAGNOSIS — A419 Sepsis, unspecified organism: Secondary | ICD-10-CM | POA: Diagnosis not present

## 2023-09-16 LAB — BASIC METABOLIC PANEL
Anion gap: 9 (ref 5–15)
BUN: <5 mg/dL — ABNORMAL LOW (ref 6–20)
CO2: 20 mmol/L — ABNORMAL LOW (ref 22–32)
Calcium: 8 mg/dL — ABNORMAL LOW (ref 8.9–10.3)
Chloride: 108 mmol/L (ref 98–111)
Creatinine, Ser: 0.74 mg/dL (ref 0.44–1.00)
GFR, Estimated: >60 mL/min (ref >60)
Glucose, Bld: 97 mg/dL (ref 70–99)
Potassium: 3.9 mmol/L (ref 3.5–5.1)
Sodium: 137 mmol/L (ref 135–145)

## 2023-09-16 LAB — CBC
HCT: 30.5 % — ABNORMAL LOW (ref 36.0–46.0)
Hemoglobin: 10.4 g/dL — ABNORMAL LOW (ref 12.0–15.0)
MCH: 28.7 pg (ref 26.0–34.0)
MCHC: 34.1 g/dL (ref 30.0–36.0)
MCV: 84 fL (ref 80.0–100.0)
Platelets: 316 10*3/uL (ref 150–400)
RBC: 3.63 MIL/uL — ABNORMAL LOW (ref 3.87–5.11)
RDW: 14.6 % (ref 11.5–15.5)
WBC: 13.9 10*3/uL — ABNORMAL HIGH (ref 4.0–10.5)
nRBC: 0 % (ref 0.0–0.2)

## 2023-09-16 LAB — URINE CULTURE: Culture: >=100000 — AB

## 2023-09-16 LAB — LACTIC ACID, PLASMA: Lactic Acid, Venous: 1.4 mmol/L (ref 0.5–1.9)

## 2023-09-16 LAB — HEPATITIS A ANTIBODY, TOTAL: hep A Total Ab: REACTIVE — AB

## 2023-09-16 MED ORDER — PROCHLORPERAZINE EDISYLATE 10 MG/2ML IJ SOLN
10.0000 mg | Freq: Four times a day (QID) | INTRAMUSCULAR | Status: DC | PRN
  Administered 2023-09-16: 10 mg via INTRAVENOUS
  Filled 2023-09-16: qty 2

## 2023-09-16 MED ORDER — HYDROMORPHONE HCL 1 MG/ML IJ SOLN: 0.5000 mg | INTRAMUSCULAR | Status: DC | PRN

## 2023-09-16 NOTE — Progress Notes (Signed)
 PROGRESS NOTE  Julie Rubio  DOB: 09-Mar-2006  PCP: [facility] [place] FMW:[id number]  DOA: 09/13/2023  LOS: 3 days  Hospital Day: 4  Brief narrative: Julie Rubio is a 18 y.o. female with PMH significant for congenital solitary kidney, abdominal migraine, reflux esophagitis 1/8, patient presented to the ED for progressively worsening infection on her left thigh. 3 days prior, patient got a tattoo on her left thigh for her birthday.  She started notice redness and worsening pain in the area.  On [birth year] on [birth year], symptoms worsened.  She also developed fever of 103, nausea, vomiting and hence presented to the ED  In the ED, patient was febrile, tachycardic, hypotensive Examination showed cellulitis of left upper thigh without any crepitus, or compartment syndrome Labs showed WBC count elevated to 38, lactic acid elevated 3.6 CT scan of the left thigh showed mild subcutaneous edema and skin thickening without any fluid collection, abscess. Urinalysis positive for UTI Blood culture and urine culture were sent Patient was started on broad-spectrum IV antibiotics Patient was admitted to [facility]  In the next few hours, patient continued to worsen hemodynamically and developed septic shock.  PCCM was consulted and patient was transferred to ICU, started on pressors. 1/11, clinically improved and transferred to [facility]  Subjective: Patient was seen and examined this morning. Pleasant young African-American female.  Somnolent.  Opens eyes on room air.  Feels better than at presentation. Mother at bedside. Chart reviewed In the last 24 hours, no fever.  Tachycardia improving to 90s, blood pressure still in low in the 90s, breathing on room air. Labs from this morning with WC count improved to 13.9  Assessment and plan: Septic shock -POA Left thigh cellulitis No evidence of necrotizing fasciitis or abscess on CT scan Needed pressors in ICU.  Subsequently started on  midodrine  10 mg 3 times daily Blood culture without growth so far Currently on IV Unasyn  and Zyvox  Continue IV hydration Repeat labs this morning with improving WBC count.  Lactic acid level normalized. Recent Labs  Lab 09/13/23 1910 09/13/23 1921 09/14/23 0520 09/14/23 0607 09/14/23 0920 09/14/23 1506 09/15/23 0335 09/16/23 1031  WBC 38.3*  --   --  38.7*  --   --  28.6* 13.9*  LATICACIDVEN  --    < > 4.7* 2.6* 2.6* 2.1*  --  1.4   < > = values in this interval not displayed.   UTI with oxacillin resistant Staph aureus Urine culture is growing more than 1000 CFU per mL of Staph aureus Currently on Zyvox   AKI Mild.  Improving with IV fluid Recent Labs    01/17/23 1945 09/13/23 1910 09/14/23 0607 09/15/23 0334 09/16/23 1031  BUN 6 14 12 8  <5*  CREATININE 0.85 1.09* 1.05* 0.99 0.74   Oral thrush Treating with nystatin  swish and swallow.  HIV negative.   Hyponatremia improved with IV fluid Recent Labs  Lab 09/13/23 1910 09/14/23 0607 09/15/23 0334 09/16/23 1031  NA 133* 131* 134* 137     Mobility: Encourage ambulation  Goals of care   Code Status: Full Code     DVT prophylaxis:  SCDs Start: 09/13/23 2305 Place TED hose Start: 09/13/23 2305   Antimicrobials: Currently on IV Unasyn  Zyvox  Fluid: None Consultants: None Family Communication: Mom at bedside  Status: Patient Level of care:  Telemetry Medical   Patient is from: Home Needs to continue in-hospital care: Recommend septic shock Anticipated d/c to: Home in 1 to 2 days  Diet:  Diet Order             Diet regular Room service appropriate? Yes; Fluid consistency: Thin  Diet effective now                   Scheduled Meds:  Chlorhexidine  Gluconate Cloth  6 each Topical Daily   linezolid   600 mg Oral Q12H   midodrine   10 mg Oral TID WC   mupirocin  ointment  1 Application Nasal BID   nystatin   5 mL Oral QID   silver  sulfADIAZINE    Topical Daily   sodium chloride  flush  3  mL Intravenous Q12H    PRN meds: acetaminophen  **OR** acetaminophen , diphenhydrAMINE , HYDROmorphone  (DILAUDID ) injection, ondansetron  **OR** ondansetron  (ZOFRAN ) IV, mouth rinse, oxyCODONE -acetaminophen , senna-docusate, sodium chloride  flush   Infusions:   ampicillin -sulbactam (UNASYN ) IV 3 g (09/16/23 1207)    Antimicrobials: Anti-infectives (From admission, onward)    Start     Dose/Rate Route Frequency Ordered Stop   09/15/23 1800  Ampicillin -Sulbactam (UNASYN ) 3 g in sodium chloride  0.9 % 100 mL IVPB        3 g 200 mL/hr over 30 Minutes Intravenous Every 6 hours 09/15/23 1032     09/14/23 1015  linezolid  (ZYVOX ) tablet 600 mg        600 mg Oral Every 12 hours 09/14/23 0929     09/14/23 0600  clindamycin  (CLEOCIN ) IVPB 300 mg  Status:  Discontinued        300 mg 100 mL/hr over 30 Minutes Intravenous Every 6 hours 09/14/23 0541 09/14/23 0929   09/14/23 0000  piperacillin -tazobactam (ZOSYN ) IVPB 3.375 g  Status:  Discontinued        3.375 g 12.5 mL/hr over 240 Minutes Intravenous Every 8 hours 09/13/23 2316 09/15/23 1032   09/13/23 2330  vancomycin  (VANCOREADY) IVPB 1250 mg/250 mL  Status:  Discontinued        1,250 mg 166.7 mL/hr over 90 Minutes Intravenous Every 24 hours 09/13/23 2316 09/14/23 0929   09/13/23 2200  cefTRIAXone  (ROCEPHIN ) 2 g in sodium chloride  0.9 % 100 mL IVPB        2 g 200 mL/hr over 30 Minutes Intravenous Once 09/13/23 2145 09/13/23 2357   09/13/23 2200  vancomycin  (VANCOCIN ) IVPB 1000 mg/200 mL premix  Status:  Discontinued        1,000 mg 200 mL/hr over 60 Minutes Intravenous  Once 09/13/23 2145 09/13/23 2316       Objective: Vitals:   09/16/23 0633 09/16/23 1216  BP: (!) 98/51 108/64  Pulse: 74 77  Resp: 18 18  Temp: 97.6 F (36.4 C) (!) 97.3 F (36.3 C)  SpO2: 96% 98%    Intake/Output Summary (Last 24 hours) at 09/16/2023 1351 Last data filed at 09/15/2023 1500 Gross per 24 hour  Intake 58 ml  Output --  Net 58 ml   Filed Weights    09/14/23 0700 09/15/23 0500  Weight: 54.5 kg 54.5 kg   Weight change:  Body mass index is 20.62 kg/m.   Physical Exam: General exam: Pleasant, young African-American female.  Not in distress Skin: No rashes, lesions or ulcers. HEENT: Atraumatic, normocephalic, no obvious bleeding Lungs: Clear to auscultation bilaterally,  CVS: S1, S2, no murmur,   GI/Abd: Soft, nontender, nondistended, bowel sound present,   CNS: Sleeping, opens eyes on command.  Not in distress Psychiatry: Mood appropriate,  Extremities: No pedal edema, no calf tenderness.  Left thigh with bandage on  Data Review: I have personally  reviewed the laboratory data and studies available.  F/u labs ordered Unresulted Labs (From admission, onward)     Start     Ordered   09/17/23 0500  CBC with Differential/Platelet  Daily,   R     Question:  Specimen collection method  Answer:  Lab=Lab collect   09/16/23 1001   09/17/23 0500  Basic metabolic panel  Daily,   R     Question:  Specimen collection method  Answer:  Lab=Lab collect   09/16/23 1001   09/16/23 0500  Hepatitis B surface antibody,quantitative  Tomorrow morning,   R       Question:  Specimen collection method  Answer:  Lab=Lab collect   09/15/23 1433   09/13/23 2325  Urine Culture (for pregnant, neutropenic or urologic patients or patients with an indwelling urinary catheter)  (Urine Labs)  Add-on,   AD       Question Answer Comment  Indication Sepsis   Release to patient Immediate      09/13/23 2324           Total time spent in review of labs and imaging, patient evaluation, formulation of plan, documentation and communication with family: 55 minutes  Signed, Chapman Rota, MD [facility] 09/16/2023

## 2023-09-16 NOTE — Progress Notes (Signed)
 [facility]  Date of Admission:  09/13/2023   Total days of inpatient antibiotics 2  Principal Problem:   Sepsis due to cellulitis [facility]) Active Problems:   Cellulitis of left thigh   AKI (acute kidney injury) (HCC)   Acute cystitis   High anion gap metabolic acidosis   Severe sepsis (HCC)   Hyponatremia   Tattoo   Thrush   Need for hepatitis B screening test   Therapeutic drug monitoring   Severe sepsis with septic shock [facility])          Assessment: 18 year old female with history of congenital absence of 1 kidney and recent tattoo on her 18th birthday 1//09/2023 admitted with: #Septic shock secondary to soft tissue infection.at siite of left thigh(tattoo location) #UA growing MRSA - Blood cultures remain negative from admission.  UA with 6-10 sqm epithelial cells, rare bacteria, 0-5 non-squamous epithelial, moderate leukocytes, negative nitrites with cultures growing MRSA could be contamination(sqm and non sqm epithelia+UA) versus occult uremia. Agree clinical story is not really consistent with previous occult  bacteremia.  -CT femur showed mild subcutaneous edema in left thigh, no abscess.  Due to bad experience prior MRI thoracic and L-spine T and L-spine in May 2024.  MRI next week. Plan: - Continue Unasyn  and linezolid . Pt states wound feels better with less swelling - MRI possibly next week pending wound - Folllow blood Cx  #Diabetes mellitus - Management per primary  #Screening - HIV negative, - Hep A immune - Hep B immunity. - Declined PrEP  #Questionable thrush - Swish and swallow  Microbiology:   Antibiotics:  Linezolid  1/9-present Unasyn  1/10-present Clindamycin  1/10 Pip-tazo 1/8 - 1/10 Cultures: Blood 1/8 no growth 1/11 no growth  Urine 1/8 MRSA Other   SUBJECTIVE: Resitng in bed, mohter at beside Interval: Afebrile overnight.  WBC 30.9 K.  Review of Systems: Review of Systems  All other systems  reviewed and are negative.    Scheduled Meds:  Chlorhexidine  Gluconate Cloth  6 each Topical Daily   linezolid   600 mg Oral Q12H   midodrine   10 mg Oral TID WC   mupirocin  ointment  1 Application Nasal BID   nystatin   5 mL Oral QID   silver  sulfADIAZINE    Topical Daily   sodium chloride  flush  3 mL Intravenous Q12H   Continuous Infusions:  ampicillin -sulbactam (UNASYN ) IV 3 g (09/16/23 1207)   PRN Meds:.acetaminophen  **OR** acetaminophen , diphenhydrAMINE , HYDROmorphone  (DILAUDID ) injection, ondansetron  **OR** ondansetron  (ZOFRAN ) IV, mouth rinse, oxyCODONE -acetaminophen , prochlorperazine , senna-docusate, sodium chloride  flush Allergies  Allergen Reactions   Morphine  Itching   Nsaids     Pt has one kidney    OBJECTIVE: Vitals:   09/15/23 1615 09/15/23 2027 09/16/23 0633 09/16/23 1216  BP:  (!) 105/47 (!) 98/51 108/64  Pulse: 92 69 74 77  Resp: 18 20 18 18   Temp:  97.7 F (36.5 C) 97.6 F (36.4 C) (!) 97.3 F (36.3 C)  TempSrc:  Oral Oral Oral  SpO2: 100% 98% 96% 98%  Weight:      Height:       Body mass index is 20.62 kg/m.  Physical Exam Constitutional:      Appearance: Normal appearance.  HENT:     Head: Normocephalic and atraumatic.     Right Ear: Tympanic membrane normal.     Left Ear: Tympanic membrane normal.     Nose: Nose normal.     Mouth/Throat:     Mouth: Mucous  membranes are moist.  Eyes:     Extraocular Movements: Extraocular movements intact.     Conjunctiva/sclera: Conjunctivae normal.     Pupils: Pupils are equal, round, and reactive to light.  Cardiovascular:     Rate and Rhythm: Normal rate and regular rhythm.     Heart sounds: No murmur heard.    No friction rub. No gallop.  Pulmonary:     Effort: Pulmonary effort is normal.     Breath sounds: Normal breath sounds.  Abdominal:     General: Abdomen is flat.     Palpations: Abdomen is soft.  Musculoskeletal:     Comments: Left thigh wound bandaged  Skin:    General: Skin is warm and  dry.  Neurological:     General: No focal deficit present.     Mental Status: She is alert and oriented to person, place, and time.  Psychiatric:        Mood and Affect: Mood normal.       Lab Results Lab Results  Component Value Date   WBC 13.9 (H) 09/16/2023   HGB 10.4 (L) 09/16/2023   HCT 30.5 (L) 09/16/2023   MCV 84.0 09/16/2023   PLT 316 09/16/2023    Lab Results  Component Value Date   CREATININE 0.74 09/16/2023   BUN <5 (L) 09/16/2023   NA 137 09/16/2023   K 3.9 09/16/2023   CL 108 09/16/2023   CO2 20 (L) 09/16/2023    Lab Results  Component Value Date   ALT 14 09/15/2023   AST 24 09/15/2023   ALKPHOS 66 09/15/2023   BILITOT 0.5 09/15/2023        Loney Stank, MD [facility] 09/16/2023, 2:17 PM I have personally spent 52 minutes involved in face-to-face and non-face-to-face activities for this patient on the day of the visit. Professional time spent includes the following activities: Preparing to see the patient (review of tests), Obtaining and/or reviewing separately obtained history (admission/discharge record), Performing a medically appropriate examination and/or evaluation , Ordering medications/tests/procedures, referring and communicating with other health care professionals, Documenting clinical information in the EMR, Independently interpreting results (not separately reported), Communicating results to the patient/family/caregiver, Counseling and educating the patient/family/caregiver and Care coordination (not separately reported).

## 2023-09-17 DIAGNOSIS — L0390 Cellulitis, unspecified: Secondary | ICD-10-CM | POA: Diagnosis not present

## 2023-09-17 DIAGNOSIS — A419 Sepsis, unspecified organism: Secondary | ICD-10-CM | POA: Diagnosis not present

## 2023-09-17 LAB — HEMOGLOBIN A1C
Hgb A1c MFr Bld: 5.3 % (ref 4.8–5.6)
Mean Plasma Glucose: 105.41 mg/dL

## 2023-09-17 LAB — CBC WITH DIFFERENTIAL/PLATELET
Abs Immature Granulocytes: 0 10*3/uL (ref 0.00–0.07)
Basophils Absolute: 0 10*3/uL (ref 0.0–0.1)
Basophils Relative: 0 %
Eosinophils Absolute: 0.7 10*3/uL — ABNORMAL HIGH (ref 0.0–0.5)
Eosinophils Relative: 5 %
HCT: 30.4 % — ABNORMAL LOW (ref 36.0–46.0)
Hemoglobin: 10.3 g/dL — ABNORMAL LOW (ref 12.0–15.0)
Lymphocytes Relative: 22 %
Lymphs Abs: 2.9 10*3/uL (ref 0.7–4.0)
MCH: 28.4 pg (ref 26.0–34.0)
MCHC: 33.9 g/dL (ref 30.0–36.0)
MCV: 83.7 fL (ref 80.0–100.0)
Monocytes Absolute: 1.7 10*3/uL — ABNORMAL HIGH (ref 0.1–1.0)
Monocytes Relative: 13 %
Neutro Abs: 8 10*3/uL — ABNORMAL HIGH (ref 1.7–7.7)
Neutrophils Relative %: 60 %
Platelets: 352 10*3/uL (ref 150–400)
RBC: 3.63 MIL/uL — ABNORMAL LOW (ref 3.87–5.11)
RDW: 14.7 % (ref 11.5–15.5)
WBC: 13.3 10*3/uL — ABNORMAL HIGH (ref 4.0–10.5)
nRBC: 0 % (ref 0.0–0.2)
nRBC: 0 /100{WBCs}

## 2023-09-17 LAB — BASIC METABOLIC PANEL
Anion gap: 9 (ref 5–15)
BUN: <5 mg/dL — ABNORMAL LOW (ref 6–20)
CO2: 22 mmol/L (ref 22–32)
Calcium: 8.3 mg/dL — ABNORMAL LOW (ref 8.9–10.3)
Chloride: 106 mmol/L (ref 98–111)
Creatinine, Ser: 0.76 mg/dL (ref 0.44–1.00)
GFR, Estimated: >60 mL/min (ref >60)
Glucose, Bld: 85 mg/dL (ref 70–99)
Potassium: 3.9 mmol/L (ref 3.5–5.1)
Sodium: 137 mmol/L (ref 135–145)

## 2023-09-17 LAB — RPR: RPR Ser Ql: NONREACTIVE

## 2023-09-17 NOTE — Plan of Care (Signed)
  Problem: Education: Goal: Knowledge of General Education information will improve Description: Including pain rating scale, medication(s)/side effects and non-pharmacologic comfort measures Outcome: Progressing   Problem: Clinical Measurements: Goal: Diagnostic test results will improve Outcome: Progressing   Problem: Skin Integrity: Goal: Risk for impaired skin integrity will decrease Outcome: Progressing   Problem: Pain Management: Goal: General experience of comfort will improve Outcome: Progressing

## 2023-09-17 NOTE — Progress Notes (Signed)
 PROGRESS NOTE  Julie Rubio  DOB: August 07, 2006  PCP: [facility] [place] FMW:[id number]  DOA: 09/13/2023  LOS: 4 days  Hospital Day: 5  Brief narrative: Julie Rubio is a 18 y.o. female with PMH significant for congenital solitary kidney, abdominal migraine, reflux esophagitis 1/8, patient presented to the ED for progressively worsening infection on her left thigh. 3 days prior, patient got a tattoo on her left thigh for her birthday.  She started notice redness and worsening pain in the area.  [birth year] on [birth year], symptoms worsened.  She also developed fever of 103, nausea, vomiting and hence presented to the ED  In the ED, patient was febrile, tachycardic, hypotensive Examination showed cellulitis of left upper thigh without any crepitus, or compartment syndrome Labs showed WBC count elevated to 38, lactic acid elevated 3.6 CT scan of the left thigh showed mild subcutaneous edema and skin thickening without any fluid collection, abscess. Urinalysis positive for UTI Blood culture and urine culture were sent Patient was started on broad-spectrum IV antibiotics Patient was admitted to [facility]  In the next few hours, patient continued to worsen hemodynamically and developed septic shock.  PCCM was consulted and patient was transferred to ICU, started on pressors. 1/11, clinically improved and transferred to [facility]  Subjective: Patient was seen and examined this morning. Lying on bed.  Not in distress.  More awake today.  Mother at bedside. WBC count shows improvement.  Assessment and plan: Septic shock -POA Left thigh cellulitis No evidence of necrotizing fasciitis or abscess on CT scan Needed pressors in ICU. Subsequently started on midodrine  10 mg 3 times daily Blood culture without growth so far. Currently on IV Unasyn  and Zyvox  Continue IV hydration Repeat labs this morning with improving WBC count. Lactic acid level normalized. Further imaging  indicated?-defer to ID Recent Labs  Lab 09/13/23 1910 09/13/23 1921 09/14/23 0520 09/14/23 0607 09/14/23 0920 09/14/23 1506 09/15/23 0335 09/16/23 1031 09/17/23 0454  WBC 38.3*  --   --  38.7*  --   --  28.6* 13.9* 13.3*  LATICACIDVEN  --    < > 4.7* 2.6* 2.6* 2.1*  --  1.4  --    < > = values in this interval not displayed.   MRSA in urine Urine culture is growing more than 1000 CFU per mL of Staph aureus True infection versus contamination. ID following. Currently on Zyvox   AKI Mild.  Improving with IV fluid Recent Labs    01/17/23 1945 09/13/23 1910 09/14/23 0607 09/15/23 0334 09/16/23 1031 09/17/23 0454  BUN 6 14 12 8  <5* <5*  CREATININE 0.85 1.09* 1.05* 0.99 0.74 0.76   Oral thrush Treating with nystatin  swish and swallow.  HIV negative. No history of diabetes.  A1c 5.3.   Hyponatremia improved with IV fluid Recent Labs  Lab 09/13/23 1910 09/14/23 0607 09/15/23 0334 09/16/23 1031 09/17/23 0454  NA 133* 131* 134* 137 137    Mobility: Encourage ambulation  Goals of care   Code Status: Full Code     DVT prophylaxis:  SCDs Start: 09/13/23 2305 Place TED hose Start: 09/13/23 2305   Antimicrobials: Currently on IV Unasyn , Zyvox  Fluid: None Consultants: None Family Communication: Mom at bedside  Status: Patient Level of care:  Telemetry Medical   Patient is from: Home Needs to continue in-hospital care: Improving septic shock Anticipated d/c to: Home in 1 to 2 days      Diet:  Diet Order  Diet regular Room service appropriate? Yes; Fluid consistency: Thin  Diet effective now                   Scheduled Meds:  Chlorhexidine  Gluconate Cloth  6 each Topical Daily   linezolid   600 mg Oral Q12H   midodrine   10 mg Oral TID WC   mupirocin  ointment  1 Application Nasal BID   nystatin   5 mL Oral QID   silver  sulfADIAZINE    Topical Daily   sodium chloride  flush  3 mL Intravenous Q12H    PRN meds: acetaminophen  **OR**  acetaminophen , diphenhydrAMINE , HYDROmorphone  (DILAUDID ) injection, ondansetron  **OR** ondansetron  (ZOFRAN ) IV, mouth rinse, oxyCODONE -acetaminophen , prochlorperazine , senna-docusate, sodium chloride  flush   Infusions:   ampicillin -sulbactam (UNASYN ) IV 3 g (09/17/23 1005)    Antimicrobials: Anti-infectives (From admission, onward)    Start     Dose/Rate Route Frequency Ordered Stop   09/15/23 1800  Ampicillin -Sulbactam (UNASYN ) 3 g in sodium chloride  0.9 % 100 mL IVPB        3 g 200 mL/hr over 30 Minutes Intravenous Every 6 hours 09/15/23 1032     09/14/23 1015  linezolid  (ZYVOX ) tablet 600 mg        600 mg Oral Every 12 hours 09/14/23 0929     09/14/23 0600  clindamycin  (CLEOCIN ) IVPB 300 mg  Status:  Discontinued        300 mg 100 mL/hr over 30 Minutes Intravenous Every 6 hours 09/14/23 0541 09/14/23 0929   09/14/23 0000  piperacillin -tazobactam (ZOSYN ) IVPB 3.375 g  Status:  Discontinued        3.375 g 12.5 mL/hr over 240 Minutes Intravenous Every 8 hours 09/13/23 2316 09/15/23 1032   09/13/23 2330  vancomycin  (VANCOREADY) IVPB 1250 mg/250 mL  Status:  Discontinued        1,250 mg 166.7 mL/hr over 90 Minutes Intravenous Every 24 hours 09/13/23 2316 09/14/23 0929   09/13/23 2200  cefTRIAXone  (ROCEPHIN ) 2 g in sodium chloride  0.9 % 100 mL IVPB        2 g 200 mL/hr over 30 Minutes Intravenous Once 09/13/23 2145 09/13/23 2357   09/13/23 2200  vancomycin  (VANCOCIN ) IVPB 1000 mg/200 mL premix  Status:  Discontinued        1,000 mg 200 mL/hr over 60 Minutes Intravenous  Once 09/13/23 2145 09/13/23 2316       Objective: Vitals:   09/17/23 0421 09/17/23 1000  BP: (!) 102/47 (!) 113/58  Pulse: 84 65  Resp: 18   Temp: 98.9 F (37.2 C) 98.4 F (36.9 C)  SpO2: 100% 98%    Intake/Output Summary (Last 24 hours) at 09/17/2023 1421 Last data filed at 09/16/2023 2101 Gross per 24 hour  Intake 480 ml  Output --  Net 480 ml   Filed Weights   09/14/23 0700 09/15/23 0500  Weight:  54.5 kg 54.5 kg   Weight change:  Body mass index is 20.62 kg/m.   Physical Exam: General exam: Pleasant, young African-American female.  Not in distress Skin: No rashes, lesions or ulcers. HEENT: Atraumatic, normocephalic, no obvious bleeding Lungs: Clear to auscultation bilaterally,  CVS: S1, S2, no murmur,   GI/Abd: Soft, nontender, nondistended, bowel sound present,   CNS: Alert, awake, oriented x 3 Psychiatry: Mood appropriate,  Extremities: No pedal edema, no calf tenderness.  Left thigh with bandage on  Data Review: I have personally reviewed the laboratory data and studies available.  F/u labs ordered Unresulted Labs (From admission, onward)  Start     Ordered   09/17/23 0500  CBC with Differential/Platelet  Daily,   R     Question:  Specimen collection method  Answer:  Lab=Lab collect   09/16/23 1001   09/17/23 0500  Basic metabolic panel  Daily,   R     Question:  Specimen collection method  Answer:  Lab=Lab collect   09/16/23 1001   09/16/23 0500  Hepatitis B surface antibody,quantitative  Tomorrow morning,   R       Question:  Specimen collection method  Answer:  Lab=Lab collect   09/15/23 1433   Pending  Nasopharyngeal Culture  Once,   R        Pending           Total time spent in review of labs and imaging, patient evaluation, formulation of plan, documentation and communication with family: 45 minutes  Signed, Chapman Rota, MD [facility] 09/17/2023

## 2023-09-18 ENCOUNTER — Other Ambulatory Visit (HOSPITAL_COMMUNITY): Payer: Self-pay

## 2023-09-18 ENCOUNTER — Telehealth (HOSPITAL_COMMUNITY): Payer: Self-pay | Admitting: Pharmacy Technician

## 2023-09-18 DIAGNOSIS — A419 Sepsis, unspecified organism: Secondary | ICD-10-CM | POA: Diagnosis not present

## 2023-09-18 DIAGNOSIS — L0390 Cellulitis, unspecified: Secondary | ICD-10-CM | POA: Diagnosis not present

## 2023-09-18 LAB — BASIC METABOLIC PANEL
Anion gap: 8 (ref 5–15)
BUN: 6 mg/dL (ref 6–20)
CO2: 23 mmol/L (ref 22–32)
Calcium: 8.4 mg/dL — ABNORMAL LOW (ref 8.9–10.3)
Chloride: 104 mmol/L (ref 98–111)
Creatinine, Ser: 0.76 mg/dL (ref 0.44–1.00)
GFR, Estimated: >60 mL/min (ref >60)
Glucose, Bld: 88 mg/dL (ref 70–99)
Potassium: 3.9 mmol/L (ref 3.5–5.1)
Sodium: 135 mmol/L (ref 135–145)

## 2023-09-18 LAB — CBC WITH DIFFERENTIAL/PLATELET
Abs Immature Granulocytes: 0.15 10*3/uL — ABNORMAL HIGH (ref 0.00–0.07)
Basophils Absolute: 0 10*3/uL (ref 0.0–0.1)
Basophils Relative: 0 %
Eosinophils Absolute: 0.4 10*3/uL (ref 0.0–0.5)
Eosinophils Relative: 4 %
HCT: 30.8 % — ABNORMAL LOW (ref 36.0–46.0)
Hemoglobin: 10.5 g/dL — ABNORMAL LOW (ref 12.0–15.0)
Immature Granulocytes: 1 %
Lymphocytes Relative: 46 %
Lymphs Abs: 4.6 10*3/uL — ABNORMAL HIGH (ref 0.7–4.0)
MCH: 28.4 pg (ref 26.0–34.0)
MCHC: 34.1 g/dL (ref 30.0–36.0)
MCV: 83.2 fL (ref 80.0–100.0)
Monocytes Absolute: 1.1 10*3/uL — ABNORMAL HIGH (ref 0.1–1.0)
Monocytes Relative: 10 %
Neutro Abs: 4.1 10*3/uL (ref 1.7–7.7)
Neutrophils Relative %: 39 %
Platelets: 391 10*3/uL (ref 150–400)
RBC: 3.7 MIL/uL — ABNORMAL LOW (ref 3.87–5.11)
RDW: 14.6 % (ref 11.5–15.5)
Smear Review: NORMAL
WBC: 10.4 10*3/uL (ref 4.0–10.5)
nRBC: 0 % (ref 0.0–0.2)

## 2023-09-18 LAB — CULTURE, BLOOD (ROUTINE X 2)
Culture: NO GROWTH
Culture: NO GROWTH

## 2023-09-18 MED ORDER — LINEZOLID 600 MG PO TABS
600.0000 mg | ORAL_TABLET | Freq: Two times a day (BID) | ORAL | 0 refills | Status: DC
  Filled 2023-09-18: qty 22, 11d supply, fill #0

## 2023-09-18 NOTE — Progress Notes (Signed)
 [facility]  Date of Admission:  09/13/2023     Total days of antibiotics 6         ASSESSMENT:  Ms. Agar continues to clinically improve and tolerate antibiotics in the setting of now resolved sepsis secondary to cellulitis of a new tattoo on her left thigh. No strong indication for additional imaging at this time. Discussed plan of care to continue antibiotics for a total of 2 weeks from 09/15/23 with end date of 09/29/23. Will narrow to Zyvox  alone at discharge and discontinue Unasyn . Continue tattoo care. Follow up in ID office on  09/28/23 at 2pm. Remaining medical and supportive care per Internal Medicine.   PLAN:  Continue current dose of linezolid  through 09/29/23.  Discontinue Unasyn  Wound care of tattoo. Follow up in ID clinic.  Remaining medical and supportive care per Internal Medicine.   Principal Problem:   Sepsis due to cellulitis [facility]) Active Problems:   Cellulitis of left thigh   AKI (acute kidney injury) (HCC)   Acute cystitis   High anion gap metabolic acidosis   Severe sepsis (HCC)   Hyponatremia   Tattoo   Thrush   Need for hepatitis B screening test   Therapeutic drug monitoring   Severe sepsis with septic shock (HCC)    Chlorhexidine  Gluconate Cloth  6 each Topical Daily   linezolid   600 mg Oral Q12H   midodrine   10 mg Oral TID WC   mupirocin  ointment  1 Application Nasal BID   nystatin   5 mL Oral QID   silver  sulfADIAZINE    Topical Daily   sodium chloride  flush  3 mL Intravenous Q12H    SUBJECTIVE:  Afebrile overnight with no acute events. Sleeping. Mother at bedside. Has been up and walking around.   Allergies  Allergen Reactions   Morphine  Itching   Nsaids     Pt has one kidney     Review of Systems: Review of Systems  Constitutional:  Negative for chills, fever and weight loss.  Respiratory:  Negative for cough, shortness of breath and wheezing.   Cardiovascular:  Negative for chest pain and leg  swelling.  Gastrointestinal:  Negative for abdominal pain, constipation, diarrhea, nausea and vomiting.  Skin:  Negative for rash.      OBJECTIVE: Vitals:   09/17/23 1728 09/17/23 1953 09/18/23 0451 09/18/23 0849  BP: 121/83 115/69 (!) 116/59 (!) 105/48  Pulse: (!) 58 69 62 66  Resp: 16 15 16    Temp: 98.3 F (36.8 C) 98.7 F (37.1 C) 98.4 F (36.9 C) 97.9 F (36.6 C)  TempSrc: Oral Oral Oral Oral  SpO2: 99% 100% 100% 99%  Weight:   54.6 kg   Height:       Body mass index is 20.66 kg/m.  Physical Exam Constitutional:      General: She is sleeping. She is not in acute distress.    Appearance: She is well-developed.  Cardiovascular:     Rate and Rhythm: Normal rate and regular rhythm.     Heart sounds: Normal heart sounds.  Pulmonary:     Effort: Pulmonary effort is normal.     Breath sounds: Normal breath sounds.  Skin:    General: Skin is warm and dry.  Neurological:     Mental Status: She is oriented to person, place, and time.  Psychiatric:        Behavior: Behavior normal.        Thought Content: Thought content normal.  Judgment: Judgment normal.     Lab Results Lab Results  Component Value Date   WBC 10.4 09/18/2023   HGB 10.5 (L) 09/18/2023   HCT 30.8 (L) 09/18/2023   MCV 83.2 09/18/2023   PLT 391 09/18/2023    Lab Results  Component Value Date   CREATININE 0.76 09/18/2023   BUN 6 09/18/2023   NA 135 09/18/2023   K 3.9 09/18/2023   CL 104 09/18/2023   CO2 23 09/18/2023    Lab Results  Component Value Date   ALT 14 09/15/2023   AST 24 09/15/2023   ALKPHOS 66 09/15/2023   BILITOT 0.5 09/15/2023     Microbiology: Recent Results (from the past 240 hours)  Resp panel by RT-PCR (RSV, Flu A&B, Covid) Anterior Nasal Swab     Status: None   Collection Time: 09/13/23  7:10 PM   Specimen: Anterior Nasal Swab  Result Value Ref Range Status   SARS Coronavirus 2 by RT PCR NEGATIVE NEGATIVE Final   Influenza A by PCR NEGATIVE NEGATIVE Final    Influenza B by PCR NEGATIVE NEGATIVE Final    Comment: (NOTE) The Xpert Xpress SARS-CoV-2/FLU/RSV plus assay is intended as an aid in the diagnosis of influenza from Nasopharyngeal swab specimens and should not be used as a sole basis for treatment. Nasal washings and aspirates are unacceptable for Xpert Xpress SARS-CoV-2/FLU/RSV testing.  Fact Sheet for Patients: bloggercourse.com  Fact Sheet for Healthcare Providers: seriousbroker.it  This test is not yet approved or cleared by the [place]  FDA and has been authorized for detection and/or diagnosis of SARS-CoV-2 by FDA under an Emergency Use Authorization (EUA). This EUA will remain in effect (meaning this test can be used) for the duration of the COVID-19 declaration under Section 564(b)(1) of the Act, 21 U.S.C. section 360bbb-3(b)(1), unless the authorization is terminated or revoked.     Resp Syncytial Virus by PCR NEGATIVE NEGATIVE Final    Comment: (NOTE) Fact Sheet for Patients: bloggercourse.com  Fact Sheet for Healthcare Providers: seriousbroker.it  This test is not yet approved or cleared by the [place]  FDA and has been authorized for detection and/or diagnosis of SARS-CoV-2 by FDA under an Emergency Use Authorization (EUA). This EUA will remain in effect (meaning this test can be used) for the duration of the COVID-19 declaration under Section 564(b)(1) of the Act, 21 U.S.C. section 360bbb-3(b)(1), unless the authorization is terminated or revoked.  Performed at [facility], [address], [place], [place] [address]   Blood Culture (routine x 2)     Status: None   Collection Time: 09/13/23  7:10 PM   Specimen: BLOOD LEFT FOREARM  Result Value Ref Range Status   Specimen Description BLOOD LEFT FOREARM  Final   Special Requests   Final    BOTTLES DRAWN AEROBIC AND ANAEROBIC Blood Culture  results may not be optimal due to an inadequate volume of blood received in culture bottles   Culture   Final    NO GROWTH 5 DAYS Performed at [facility], [address], [place], [place] [address]    Report Status 09/18/2023 FINAL  Final  Blood Culture (routine x 2)     Status: None   Collection Time: 09/13/23  7:40 PM   Specimen: BLOOD  Result Value Ref Range Status   Specimen Description BLOOD RIGHT ANTECUBITAL  Final   Special Requests   Final    BOTTLES DRAWN AEROBIC AND ANAEROBIC Blood Culture results may not be  optimal due to an inadequate volume of blood received in culture bottles   Culture   Final    NO GROWTH 5 DAYS Performed at [facility], [address], [place], [place] [address]    Report Status 09/18/2023 FINAL  Final  Urine Culture     Status: Abnormal   Collection Time: 09/14/23  4:15 AM   Specimen: Urine, Clean Catch  Result Value Ref Range Status   Specimen Description URINE, CLEAN CATCH  Final   Special Requests   Final    NONE Performed at [facility], [address], [place], [place] [address]    Culture >=100,000 COLONIES/mL STAPHYLOCOCCUS AUREUS (A)  Final   Report Status 09/16/2023 FINAL  Final   Organism ID, Bacteria STAPHYLOCOCCUS AUREUS (A)  Final      Susceptibility   Staphylococcus aureus - MIC*    CIPROFLOXACIN <=0.5 SENSITIVE Sensitive     GENTAMICIN <=0.5 SENSITIVE Sensitive     NITROFURANTOIN <=16 SENSITIVE Sensitive     OXACILLIN >=4 RESISTANT Resistant     TETRACYCLINE <=1 SENSITIVE Sensitive     VANCOMYCIN  1 SENSITIVE Sensitive     TRIMETH/SULFA <=10 SENSITIVE Sensitive     RIFAMPIN <=0.5 SENSITIVE Sensitive     Inducible Clindamycin  NEGATIVE Sensitive     LINEZOLID  2 SENSITIVE Sensitive     * >=100,000 COLONIES/mL STAPHYLOCOCCUS AUREUS  MRSA Next Gen by PCR, Nasal     Status: Abnormal   Collection Time: 09/14/23  7:07 AM   Specimen: Nasal Mucosa; Nasal Swab  Result Value Ref Range Status   MRSA by PCR Next  Gen DETECTED (A) NOT DETECTED Final    Comment: RESULT CALLED TO, READ BACK BY AND VERIFIED WITH: RN FELICIA on [id number] @[id number]  by SM (NOTE) The GeneXpert MRSA Assay (FDA approved for NASAL specimens only), is one component of a comprehensive MRSA colonization surveillance program. It is not intended to diagnose MRSA infection nor to guide or monitor treatment for MRSA infections. Test performance is not FDA approved in patients less than 83 years old. Performed at [facility], [address] [place], [place] [address]   Culture, blood (Routine X 2) w Reflex to ID Panel     Status: None (Preliminary result)   Collection Time: 09/16/23  4:36 AM   Specimen: BLOOD  Result Value Ref Range Status   Specimen Description BLOOD BLOOD LEFT HAND  Final   Special Requests   Final    BOTTLES DRAWN AEROBIC AND ANAEROBIC Blood Culture adequate volume   Culture   Final    NO GROWTH 2 DAYS Performed at [facility], [address], [place], [place] [address]    Report Status PENDING  Incomplete  Culture, blood (Routine X 2) w Reflex to ID Panel     Status: None (Preliminary result)   Collection Time: 09/16/23  4:42 AM   Specimen: BLOOD  Result Value Ref Range Status   Specimen Description BLOOD BLOOD RIGHT HAND  Final   Special Requests   Final    BOTTLES DRAWN AEROBIC AND ANAEROBIC Blood Culture results may not be optimal due to an inadequate volume of blood received in culture bottles   Culture   Final    NO GROWTH 2 DAYS Performed at [facility], [address], [place], [place] [address]    Report Status PENDING  Incomplete     Cathlyn July, NP [facility]  09/18/2023  1:06 PM

## 2023-09-18 NOTE — Progress Notes (Signed)
 Pt has been discharged to home. DC instructions given with mother at bedside. No concerns verbalized. Pt to stop by outpatient pharmacy on her way out of building and pick up discharge medication prescribed by Provider.

## 2023-09-18 NOTE — Plan of Care (Signed)
  Problem: Education: Goal: Knowledge of General Education information will improve Description: Including pain rating scale, medication(s)/side effects and non-pharmacologic comfort measures Outcome: Progressing   Problem: Pain Management: Goal: General experience of comfort will improve Outcome: Progressing

## 2023-09-18 NOTE — Discharge Summary (Addendum)
 Physician Discharge Summary  Julie Rubio FMW:[id number] DOB: 10-Jun-2006 DOA: 09/13/2023  PCP: [facility]  Admit date: 09/13/2023 Discharge date: 09/18/2023  Admitted From: Home Discharge disposition: Home  Recommendations at discharge:  Complete course of antibiotic with Zyvox  through 09/29/2023 Local wound care to continue at the site of tattoo.   Follow-up appointment with ID on 09/28/2023.  Brief narrative: Julie Rubio is a 18 y.o. female with PMH significant for congenital solitary kidney, abdominal migraine, reflux esophagitis 1/8, patient presented to the ED for progressively worsening infection on her left thigh. 3 days prior, patient got a tattoo on her left thigh for her birthday.  She started notice redness and worsening pain in the area.  [birth year] her [birth year] on [birth year], symptoms worsened.  She also developed fever of 103, nausea, vomiting and hence presented to the ED  In the ED, patient was febrile, tachycardic, hypotensive Examination showed cellulitis of left upper thigh without any crepitus, or compartment syndrome Labs showed WBC count elevated to 38, lactic acid elevated 3.6 CT scan of the left thigh showed mild subcutaneous edema and skin thickening without any fluid collection, abscess. Urinalysis positive for UTI Blood culture and urine culture were sent Patient was started on broad-spectrum IV antibiotics Patient was admitted to [facility]  In the next few hours, patient continued to worsen hemodynamically and developed septic shock.  PCCM was consulted and patient was transferred to ICU, started on pressors. 1/11, clinically improved and transferred to [facility] ID follow-up appreciated.  Stable for discharge home today.  Subjective: Patient was seen and examined this morning. Lying down in bed.  Not in distress.  Mother at bedside.  Hospital course: Septic shock -POA Left thigh cellulitis No evidence of necrotizing fasciitis or abscess  on CT scan Needed pressors in ICU. Subsequently started on midodrine  10 mg 3 times daily Blood culture without growth so far. Currently on IV Unasyn  and Zyvox  Repeat labs this morning with improving WBC count. Lactic acid level normalized. ID follow-up appreciated.  No need of further imaging. Per ID recommendation, okay to discharge home on Zyvox  through 09/29/2023.  Local wound care to continue at the site of tattoo.  Patient has a follow-up appointment with ID on 09/28/2023. Recent Labs  Lab 09/14/23 0520 09/14/23 0607 09/14/23 0920 09/14/23 1506 09/15/23 0335 09/16/23 1031 09/17/23 0454 09/18/23 0444  WBC  --  38.7*  --   --  28.6* 13.9* 13.3* 10.4  LATICACIDVEN 4.7* 2.6* 2.6* 2.1*  --  1.4  --   --    MRSA in urine Urine culture is growing more than 1000 CFU per mL of Staph aureus No urinary symptoms.  No bacteremia. Discussed with ID.  MRSA in urine is likely contamination and less likely a true infection infection  AKI Mild.  Improved with IV fluid. Recent Labs    01/17/23 1945 09/13/23 1910 09/14/23 0607 09/15/23 0334 09/16/23 1031 09/17/23 0454 09/18/23 0444  BUN 6 14 12 8  <5* <5* 6  CREATININE 0.85 1.09* 1.05* 0.99 0.74 0.76 0.76   Oral thrush Treating with nystatin  swish and swallow.  HIV negative. No history of diabetes.  A1c 5.3.   Hyponatremia improved with IV fluid Recent Labs  Lab 09/13/23 1910 09/14/23 0607 09/15/23 0334 09/16/23 1031 09/17/23 0454 09/18/23 0444  NA 133* 131* 134* 137 137 135   Mobility: Encourage ambulation  Goals of care   Code Status: Full Code   Diet:  Diet Order  Diet general           Diet regular Room service appropriate? Yes; Fluid consistency: Thin  Diet effective now                   Nutritional status:  Body mass index is 20.66 kg/m.       Wounds:  - Wound / Incision (Open or Dehisced) 09/14/23 Other (Comment) Thigh Anterior;Left Open, Red, Pink (Active)  Date First Assessed/Time  First Assessed: 09/14/23 0722   Wound Type: Other (Comment)  Location: Thigh  Location Orientation: Anterior;Left  Wound Description (Comments): Open, Red, Pink  Present on Admission: Yes    Assessments 09/14/2023  7:22 AM 09/17/2023  9:57 PM  Dressing Type None Gauze (Comment);Other (Comment)  Dressing Status -- Clean, Dry, Intact  Site / Wound Assessment Red;Pink Dressing in place / Unable to assess  Drainage Amount Minimal --  Drainage Description Serous --  Treatment -- Other (Comment)     No associated orders.    Discharge Exam:   Vitals:   09/17/23 1728 09/17/23 1953 09/18/23 0451 09/18/23 0849  BP: 121/83 115/69 (!) 116/59 (!) 105/48  Pulse: (!) 58 69 62 66  Resp: 16 15 16    Temp: 98.3 F (36.8 C) 98.7 F (37.1 C) 98.4 F (36.9 C) 97.9 F (36.6 C)  TempSrc: Oral Oral Oral Oral  SpO2: 99% 100% 100% 99%  Weight:   54.6 kg   Height:        Body mass index is 20.66 kg/m.  General exam: Pleasant, young African-American female.  Not in distress Skin: No rashes, lesions or ulcers. HEENT: Atraumatic, normocephalic, no obvious bleeding Lungs: Clear to auscultation bilaterally,  CVS: S1, S2, no murmur,   GI/Abd: Soft, nontender, nondistended, bowel sound present,   CNS: Alert, awake, oriented x 3 Psychiatry: Mood appropriate,  Extremities: No pedal edema, no calf tenderness.  Left thigh with bandage on  Follow ups:    Follow-up Information     [facility] Follow up.   Specialty: Family Medicine Contact information: [address] [place] [address] [phone]                 Discharge Instructions:   Discharge Instructions     Call MD for:  difficulty breathing, headache or visual disturbances   Complete by: As directed    Call MD for:  extreme fatigue   Complete by: As directed    Call MD for:  hives   Complete by: As directed    Call MD for:  persistant dizziness or light-headedness   Complete by: As directed     Call MD for:  persistant nausea and vomiting   Complete by: As directed    Call MD for:  severe uncontrolled pain   Complete by: As directed    Call MD for:  temperature >100.4   Complete by: As directed    Diet general   Complete by: As directed    Discharge instructions   Complete by: As directed    Recommendations at discharge:   Complete course of antibiotic with Zyvox  through 09/29/2023  Local wound care to continue at the site of tattoo.    Follow-up appointment with ID on 09/28/2023.   General discharge instructions: Follow with Primary MD [facility] in 7 days  Please request your PCP  to go over your hospital tests, procedures, radiology results at the follow up. Please get your  medicines reviewed and adjusted.  Your PCP may decide to repeat certain labs or tests as needed. Do not drive, operate heavy machinery, perform activities at heights, swimming or participation in water activities or provide baby sitting services if your were admitted for syncope or siezures until you have seen by Primary MD or a Neurologist and advised to do so again. [place]  Controlled Substance Reporting System database was reviewed. Do not drive, operate heavy machinery, perform activities at heights, swim, participate in water activities or provide baby-sitting services while on medications for pain, sleep and mood until your outpatient physician has reevaluated you and advised to do so again.  You are strongly recommended to comply with the dose, frequency and duration of prescribed medications. Activity: As tolerated with Full fall precautions use walker/cane & assistance as needed Avoid using any recreational substances like cigarette, tobacco, alcohol, or non-prescribed drug. If you experience worsening of your admission symptoms, develop shortness of breath, life threatening emergency, suicidal or homicidal thoughts you must seek medical attention immediately  by calling 911 or calling your MD immediately  if symptoms less severe. You must read complete instructions/literature along with all the possible adverse reactions/side effects for all the medicines you take and that have been prescribed to you. Take any new medicine only after you have completely understood and accepted all the possible adverse reactions/side effects.  Wear Seat belts while driving. You were cared for by a hospitalist during your hospital stay. If you have any questions about your discharge medications or the care you received while you were in the hospital after you are discharged, you can call the unit and ask to speak with the hospitalist or the covering physician. Once you are discharged, your primary care physician will handle any further medical issues. Please note that NO REFILLS for any discharge medications will be authorized once you are discharged, as it is imperative that you return to your primary care physician (or establish a relationship with a primary care physician if you do not have one).   Discharge wound care:   Complete by: As directed    Increase activity slowly   Complete by: As directed    Urine Culture   Complete by: As directed        Discharge Medications:   Allergies as of 09/18/2023       Reactions   Morphine  Itching   Nsaids    Pt has one kidney        Medication List     STOP taking these medications    Tylenol  Cold Multi-Symptom Day 5-10-200-325 MG/15ML Liqd Generic drug: Phenylephrine -DM-GG-APAP       TAKE these medications    linezolid  600 MG tablet Commonly known as: ZYVOX  Take 1 tablet (600 mg total) by mouth every 12 (twelve) hours for 11 days.   norethindrone 0.35 MG tablet Commonly known as: MICRONOR Take 1 tablet by mouth daily.   omeprazole 20 MG capsule Commonly known as: PRILOSEC Take 20 mg by mouth daily.   sertraline 25 MG tablet Commonly known as: ZOLOFT Take 25 mg by mouth at bedtime.                Discharge Care Instructions  (From admission, onward)           Start     Ordered   09/18/23 0000  Discharge wound care:        09/18/23 1334  The results of significant diagnostics from this hospitalization (including imaging, microbiology, ancillary and laboratory) are listed below for reference.    Procedures and Diagnostic Studies:   CT FEMUR LEFT W CONTRAST Result Date: 09/14/2023 CLINICAL DATA:  Leg pain, chronic, cellulitis of left thigh, sepsis, evaluate for focal abscess or deeper lesion. Tattoo a couple of days ago now concern for infection. Stiffness of leg. Fever. EXAM: CT OF THE LOWER LEFT EXTREMITY WITH CONTRAST TECHNIQUE: Multidetector CT imaging of the lower left extremity was performed according to the standard protocol following intravenous contrast administration. RADIATION DOSE REDUCTION: This exam was performed according to the departmental dose-optimization program which includes automated exposure control, adjustment of the mA and/or kV according to patient size and/or use of iterative reconstruction technique. CONTRAST:  75mL OMNIPAQUE  IOHEXOL  350 MG/ML SOLN COMPARISON:  None Available. FINDINGS: Bones/Joint/Cartilage No acute fracture or dislocation. No evidence of osteomyelitis. No knee joint effusion. Ligaments Suboptimally assessed by CT. Muscles and Tendons No evidence of deep tissue infection. Soft tissues Mild subcutaneous edema in the proximal left thigh with mild skin thickening. No organized fluid collection or abscess. IMPRESSION: 1. Mild subcutaneous edema in the proximal left thigh with mild skin thickening. No organized fluid collection or abscess. 2. No evidence of deep tissue infection. Electronically Signed   By: Norman Gatlin M.D.   On: 09/14/2023 02:01     Labs:   Basic Metabolic Panel: Recent Labs  Lab 09/14/23 0607 09/15/23 0334 09/16/23 1031 09/17/23 0454 09/18/23 0444  NA 131* 134* 137 137 135  K 3.9 3.8 3.9 3.9  3.9  CL 103 105 108 106 104  CO2 19* 19* 20* 22 23  GLUCOSE 99 98 97 85 88  BUN 12 8 <5* <5* 6  CREATININE 1.05* 0.99 0.74 0.76 0.76  CALCIUM 7.3* 8.0* 8.0* 8.3* 8.4*  MG 1.2* 2.0  --   --   --   PHOS 1.7* 2.3*  --   --   --    GFR Estimated Creatinine Clearance: 96.7 mL/min (by C-G formula based on SCr of 0.76 mg/dL). Liver Function Tests: Recent Labs  Lab 09/13/23 1910 09/14/23 0607 09/15/23 0334  AST 24 20 24   ALT 13 11 14   ALKPHOS 59 52 66  BILITOT 0.7 0.4 0.5  PROT 7.2 5.0* 4.8*  ALBUMIN 3.6 2.5* 2.3*   No results for input(s): LIPASE, AMYLASE in the last 168 hours. No results for input(s): AMMONIA in the last 168 hours. Coagulation profile Recent Labs  Lab 09/13/23 1910  INR 1.7*    CBC: Recent Labs  Lab 09/13/23 1910 09/14/23 0607 09/15/23 0335 09/16/23 1031 09/17/23 0454 09/18/23 0444  WBC 38.3* 38.7* 28.6* 13.9* 13.3* 10.4  NEUTROABS 36.3* 35.7* 26.6*  --  8.0* 4.1  HGB 12.2 10.2* 9.9* 10.4* 10.3* 10.5*  HCT 37.0 29.5* 29.1* 30.5* 30.4* 30.8*  MCV 87.5 85.5 84.6 84.0 83.7 83.2  PLT 365 270 277 316 352 391   Cardiac Enzymes: Recent Labs  Lab 09/14/23 0920  CKTOTAL 112   BNP: Invalid input(s): POCBNP CBG: Recent Labs  Lab 09/14/23 0717  GLUCAP 99   D-Dimer No results for input(s): DDIMER in the last 72 hours. Hgb A1c Recent Labs    09/17/23 0454  HGBA1C 5.3   Lipid Profile No results for input(s): CHOL, HDL, LDLCALC, TRIG, CHOLHDL, LDLDIRECT in the last 72 hours. Thyroid function studies No results for input(s): TSH, T4TOTAL, T3FREE, THYROIDAB in the last 72 hours.  Invalid input(s): FREET3 Anemia work up  No results for input(s): VITAMINB12, FOLATE, FERRITIN, TIBC, IRON, RETICCTPCT in the last 72 hours. Microbiology Recent Results (from the past 240 hours)  Resp panel by RT-PCR (RSV, Flu A&B, Covid) Anterior Nasal Swab     Status: None   Collection Time: 09/13/23  7:10 PM   Specimen:  Anterior Nasal Swab  Result Value Ref Range Status   SARS Coronavirus 2 by RT PCR NEGATIVE NEGATIVE Final   Influenza A by PCR NEGATIVE NEGATIVE Final   Influenza B by PCR NEGATIVE NEGATIVE Final    Comment: (NOTE) The Xpert Xpress SARS-CoV-2/FLU/RSV plus assay is intended as an aid in the diagnosis of influenza from Nasopharyngeal swab specimens and should not be used as a sole basis for treatment. Nasal washings and aspirates are unacceptable for Xpert Xpress SARS-CoV-2/FLU/RSV testing.  Fact Sheet for Patients: bloggercourse.com  Fact Sheet for Healthcare Providers: seriousbroker.it  This test is not yet approved or cleared by the [place]  FDA and has been authorized for detection and/or diagnosis of SARS-CoV-2 by FDA under an Emergency Use Authorization (EUA). This EUA will remain in effect (meaning this test can be used) for the duration of the COVID-19 declaration under Section 564(b)(1) of the Act, 21 U.S.C. section 360bbb-3(b)(1), unless the authorization is terminated or revoked.     Resp Syncytial Virus by PCR NEGATIVE NEGATIVE Final    Comment: (NOTE) Fact Sheet for Patients: bloggercourse.com  Fact Sheet for Healthcare Providers: seriousbroker.it  This test is not yet approved or cleared by the [place]  FDA and has been authorized for detection and/or diagnosis of SARS-CoV-2 by FDA under an Emergency Use Authorization (EUA). This EUA will remain in effect (meaning this test can be used) for the duration of the COVID-19 declaration under Section 564(b)(1) of the Act, 21 U.S.C. section 360bbb-3(b)(1), unless the authorization is terminated or revoked.  Performed at [facility], [address] [place], [place] [address]   Blood Culture (routine x 2)     Status: None   Collection Time: 09/13/23  7:10 PM   Specimen: BLOOD LEFT FOREARM  Result  Value Ref Range Status   Specimen Description BLOOD LEFT FOREARM  Final   Special Requests   Final    BOTTLES DRAWN AEROBIC AND ANAEROBIC Blood Culture results may not be optimal due to an inadequate volume of blood received in culture bottles   Culture   Final    NO GROWTH 5 DAYS Performed at [facility], [address], [place], [place] [address]    Report Status 09/18/2023 FINAL  Final  Blood Culture (routine x 2)     Status: None   Collection Time: 09/13/23  7:40 PM   Specimen: BLOOD  Result Value Ref Range Status   Specimen Description BLOOD RIGHT ANTECUBITAL  Final   Special Requests   Final    BOTTLES DRAWN AEROBIC AND ANAEROBIC Blood Culture results may not be optimal due to an inadequate volume of blood received in culture bottles   Culture   Final    NO GROWTH 5 DAYS Performed at [facility], [address], [place], [place] [address]    Report Status 09/18/2023 FINAL  Final  Urine Culture     Status: Abnormal   Collection Time: 09/14/23  4:15 AM   Specimen: Urine, Clean Catch  Result Value Ref Range Status   Specimen Description URINE, CLEAN CATCH  Final   Special Requests   Final    NONE Performed at [facility], [address] [place], [place] [address]    Culture >=100,000 COLONIES/mL STAPHYLOCOCCUS AUREUS (A)  Final   Report Status 09/16/2023 FINAL  Final   Organism ID, Bacteria STAPHYLOCOCCUS AUREUS (A)  Final      Susceptibility   Staphylococcus aureus - MIC*    CIPROFLOXACIN <=0.5 SENSITIVE Sensitive     GENTAMICIN <=0.5 SENSITIVE Sensitive     NITROFURANTOIN <=16 SENSITIVE Sensitive     OXACILLIN >=4 RESISTANT Resistant     TETRACYCLINE <=1 SENSITIVE Sensitive     VANCOMYCIN  1 SENSITIVE Sensitive     TRIMETH/SULFA <=10 SENSITIVE Sensitive     RIFAMPIN <=0.5 SENSITIVE Sensitive     Inducible Clindamycin  NEGATIVE Sensitive     LINEZOLID  2 SENSITIVE Sensitive     * >=100,000 COLONIES/mL STAPHYLOCOCCUS AUREUS  MRSA Next Gen by PCR,  Nasal     Status: Abnormal   Collection Time: 09/14/23  7:07 AM   Specimen: Nasal Mucosa; Nasal Swab  Result Value Ref Range Status   MRSA by PCR Next Gen DETECTED (A) NOT DETECTED Final    Comment: RESULT CALLED TO, READ BACK BY AND VERIFIED WITH: RN FELICIA on [id number] @0944  by SM (NOTE) The GeneXpert MRSA Assay (FDA approved for NASAL specimens only), is one component of a comprehensive MRSA colonization surveillance program. It is not intended to diagnose MRSA infection nor to guide or monitor treatment for MRSA infections. Test performance is not FDA approved in patients less than 39 years old. Performed at [facility], [address], [place], [place] [address]   Culture, blood (Routine X 2) w Reflex to ID Panel     Status: None (Preliminary result)   Collection Time: 09/16/23  4:36 AM   Specimen: BLOOD  Result Value Ref Range Status   Specimen Description BLOOD BLOOD LEFT HAND  Final   Special Requests   Final    BOTTLES DRAWN AEROBIC AND ANAEROBIC Blood Culture adequate volume   Culture   Final    NO GROWTH 2 DAYS Performed at [facility], [address], [place], [place] [address]    Report Status PENDING  Incomplete  Culture, blood (Routine X 2) w Reflex to ID Panel     Status: None (Preliminary result)   Collection Time: 09/16/23  4:42 AM   Specimen: BLOOD  Result Value Ref Range Status   Specimen Description BLOOD BLOOD RIGHT HAND  Final   Special Requests   Final    BOTTLES DRAWN AEROBIC AND ANAEROBIC Blood Culture results may not be optimal due to an inadequate volume of blood received in culture bottles   Culture   Final    NO GROWTH 2 DAYS Performed at [facility], [address] [place], [place] [address]    Report Status PENDING  Incomplete    Time coordinating discharge: 45 minutes  Signed: Leondra Cullin  [facility] 09/18/2023, 2:13 PM

## 2023-09-18 NOTE — Progress Notes (Signed)
Pt left unit in wheelchair pushed by hospital volunteer. Left in stable condition.

## 2023-09-18 NOTE — Telephone Encounter (Signed)
 Pharmacy Patient Advocate Encounter  Received notification from [facility] that Prior Authorization for Linezolid 600MG  tablets has been APPROVED from 09/18/2023 to 10/18/2023   PA #/Case ID/Reference #: [id number]  Key: [id number]

## 2023-09-18 NOTE — Progress Notes (Signed)
 PROGRESS NOTE  Julie Rubio  DOB: 2006/02/17  PCP: [facility] [place] FMW:[id number]  DOA: 09/13/2023  LOS: 5 days  Hospital Day: 6  Brief narrative: Julie Rubio is a 18 y.o. female with PMH significant for congenital solitary kidney, abdominal migraine, reflux esophagitis 1/8, patient presented to the ED for progressively worsening infection on her left thigh. 3 days prior, patient got a tattoo on her left thigh for her birthday.  She started notice redness and worsening pain in the area.  [birth year] on [birth year], symptoms worsened.  She also developed fever of 103, nausea, vomiting and hence presented to the ED  In the ED, patient was febrile, tachycardic, hypotensive Examination showed cellulitis of left upper thigh without any crepitus, or compartment syndrome Labs showed WBC count elevated to 38, lactic acid elevated 3.6 CT scan of the left thigh showed mild subcutaneous edema and skin thickening without any fluid collection, abscess. Urinalysis positive for UTI Blood culture and urine culture were sent Patient was started on broad-spectrum IV antibiotics Patient was admitted to [facility]  In the next few hours, patient continued to worsen hemodynamically and developed septic shock.  PCCM was consulted and patient was transferred to ICU, started on pressors. 1/11, clinically improved and transferred to [facility]  Subjective: Patient was seen and examined this morning. Lying down in bed.  Not in distress.  Mother at bedside. WBC count normalized.  Assessment and plan: Septic shock -POA Left thigh cellulitis No evidence of necrotizing fasciitis or abscess on CT scan Needed pressors in ICU. Subsequently started on midodrine  10 mg 3 times daily Blood culture without growth so far. Currently on IV Unasyn  and Zyvox  Repeat labs this morning with improving WBC count. Lactic acid level normalized. Further imaging indicated?-defer to ID Recent Labs  Lab  09/14/23 0520 09/14/23 0607 09/14/23 0920 09/14/23 1506 09/15/23 0335 09/16/23 1031 09/17/23 0454 09/18/23 0444  WBC  --  38.7*  --   --  28.6* 13.9* 13.3* 10.4  LATICACIDVEN 4.7* 2.6* 2.6* 2.1*  --  1.4  --   --    MRSA in urine Urine culture is growing more than 1000 CFU per mL of Staph aureus True infection versus contamination. ID following. Currently on Zyvox   AKI Mild.  Improved with IV fluid. Recent Labs    01/17/23 1945 09/13/23 1910 09/14/23 0607 09/15/23 0334 09/16/23 1031 09/17/23 0454 09/18/23 0444  BUN 6 14 12 8  <5* <5* 6  CREATININE 0.85 1.09* 1.05* 0.99 0.74 0.76 0.76   Oral thrush Treating with nystatin  swish and swallow.  HIV negative. No history of diabetes.  A1c 5.3.   Hyponatremia improved with IV fluid Recent Labs  Lab 09/13/23 1910 09/14/23 0607 09/15/23 0334 09/16/23 1031 09/17/23 0454 09/18/23 0444  NA 133* 131* 134* 137 137 135   Mobility: Encourage ambulation  Goals of care   Code Status: Full Code     DVT prophylaxis:  SCDs Start: 09/13/23 2305 Place TED hose Start: 09/13/23 2305   Antimicrobials: Currently on IV Unasyn , Zyvox  Fluid: None Consultants: None Family Communication: Mom at bedside  Status: Patient Level of care:  Telemetry Medical   Patient is from: Home Needs to continue in-hospital care: Improving septic shock Anticipated d/c to: Home in 1 to 2 days after clearance by ID     Diet:  Diet Order             Diet regular Room service appropriate? Yes; Fluid consistency: Thin  Diet  effective now                   Scheduled Meds:  Chlorhexidine  Gluconate Cloth  6 each Topical Daily   linezolid   600 mg Oral Q12H   midodrine   10 mg Oral TID WC   mupirocin  ointment  1 Application Nasal BID   nystatin   5 mL Oral QID   silver  sulfADIAZINE    Topical Daily   sodium chloride  flush  3 mL Intravenous Q12H    PRN meds: acetaminophen  **OR** acetaminophen , diphenhydrAMINE , HYDROmorphone  (DILAUDID )  injection, ondansetron  **OR** ondansetron  (ZOFRAN ) IV, mouth rinse, oxyCODONE -acetaminophen , prochlorperazine , senna-docusate, sodium chloride  flush   Infusions:     Antimicrobials: Anti-infectives (From admission, onward)    Start     Dose/Rate Route Frequency Ordered Stop   09/15/23 1800  Ampicillin -Sulbactam (UNASYN ) 3 g in sodium chloride  0.9 % 100 mL IVPB  Status:  Discontinued        3 g 200 mL/hr over 30 Minutes Intravenous Every 6 hours 09/15/23 1032 09/18/23 1201   09/14/23 1015  linezolid  (ZYVOX ) tablet 600 mg        600 mg Oral Every 12 hours 09/14/23 0929     09/14/23 0600  clindamycin  (CLEOCIN ) IVPB 300 mg  Status:  Discontinued        300 mg 100 mL/hr over 30 Minutes Intravenous Every 6 hours 09/14/23 0541 09/14/23 0929   09/14/23 0000  piperacillin -tazobactam (ZOSYN ) IVPB 3.375 g  Status:  Discontinued        3.375 g 12.5 mL/hr over 240 Minutes Intravenous Every 8 hours 09/13/23 2316 09/15/23 1032   09/13/23 2330  vancomycin  (VANCOREADY) IVPB 1250 mg/250 mL  Status:  Discontinued        1,250 mg 166.7 mL/hr over 90 Minutes Intravenous Every 24 hours 09/13/23 2316 09/14/23 0929   09/13/23 2200  cefTRIAXone  (ROCEPHIN ) 2 g in sodium chloride  0.9 % 100 mL IVPB        2 g 200 mL/hr over 30 Minutes Intravenous Once 09/13/23 2145 09/13/23 2357   09/13/23 2200  vancomycin  (VANCOCIN ) IVPB 1000 mg/200 mL premix  Status:  Discontinued        1,000 mg 200 mL/hr over 60 Minutes Intravenous  Once 09/13/23 2145 09/13/23 2316       Objective: Vitals:   09/18/23 0451 09/18/23 0849  BP: (!) 116/59 (!) 105/48  Pulse: 62 66  Resp: 16   Temp: 98.4 F (36.9 C) 97.9 F (36.6 C)  SpO2: 100% 99%    Intake/Output Summary (Last 24 hours) at 09/18/2023 1303 Last data filed at 09/17/2023 2323 Gross per 24 hour  Intake 380 ml  Output --  Net 380 ml   Filed Weights   09/14/23 0700 09/15/23 0500 09/18/23 0451  Weight: 54.5 kg 54.5 kg 54.6 kg   Weight change:  Body mass index is  20.66 kg/m.   Physical Exam: General exam: Pleasant, young African-American female.  Not in distress Skin: No rashes, lesions or ulcers. HEENT: Atraumatic, normocephalic, no obvious bleeding Lungs: Clear to auscultation bilaterally,  CVS: S1, S2, no murmur,   GI/Abd: Soft, nontender, nondistended, bowel sound present,   CNS: Alert, awake, oriented x 3 Psychiatry: Mood appropriate,  Extremities: No pedal edema, no calf tenderness.  Left thigh with bandage on  Data Review: I have personally reviewed the laboratory data and studies available.  F/u labs ordered Unresulted Labs (From admission, onward)     Start     Ordered   09/17/23 0500  CBC with Differential/Platelet  Daily,   R     Question:  Specimen collection method  Answer:  Lab=Lab collect   09/16/23 1001   09/17/23 0500  Basic metabolic panel  Daily,   R     Question:  Specimen collection method  Answer:  Lab=Lab collect   09/16/23 1001   09/16/23 0500  Hepatitis B surface antibody,quantitative  Tomorrow morning,   R       Question:  Specimen collection method  Answer:  Lab=Lab collect   09/15/23 1433   Pending  Nasopharyngeal Culture  Once,   R        Pending           Total time spent in review of labs and imaging, patient evaluation, formulation of plan, documentation and communication with family: 25 minutes  Signed, Chapman Rota, MD [facility] 09/18/2023

## 2023-09-19 LAB — HEPATITIS B SURFACE ANTIBODY, QUANTITATIVE: Hep B S AB Quant (Post): <3.5 m[IU]/mL — ABNORMAL LOW

## 2023-09-21 ENCOUNTER — Telehealth: Payer: Self-pay

## 2023-09-21 ENCOUNTER — Other Ambulatory Visit (HOSPITAL_COMMUNITY): Payer: Self-pay

## 2023-09-21 DIAGNOSIS — B379 Candidiasis, unspecified: Secondary | ICD-10-CM

## 2023-09-21 LAB — CULTURE, BLOOD (ROUTINE X 2)
Culture: NO GROWTH
Culture: NO GROWTH
Special Requests: ADEQUATE

## 2023-09-21 MED ORDER — FLUCONAZOLE 150 MG PO TABS: 150.0000 mg | ORAL_TABLET | Freq: Once | ORAL | 0 refills | Status: AC

## 2023-09-21 NOTE — Telephone Encounter (Signed)
Per Calone, FNP rash does not sound like reaction to antibiotics.It sounds more like a yeast like issue as opposed to antibiotic reaction - would recommend a dose of fluconazole 150 mg PO once. Prescription sent to [facility]. Left voicemail with patient's mother. Spoke with patient.  Understands to call office if symptoms worsen or do not improve. Juanita Laster, RMA

## 2023-09-21 NOTE — Addendum Note (Signed)
Addended by: Juanita Laster on: 09/21/2023 11:01 AM   Modules accepted: Orders

## 2023-09-21 NOTE — Telephone Encounter (Signed)
Patient's mother called office stating she believes patient is having reaction to Linezolid or  Silvadene cream. Noticed a rash that started around vaginal area/ thigh. Has now spread to buttocks. Rash is itchy, but has not noticed it anywhere else on her. Denis SOB.  Took benadryl last night which helped a little.  Messaged provided and pharmacy for advise regarding reaction. Juanita Laster, RMA

## 2023-09-25 ENCOUNTER — Ambulatory Visit: Payer: 59 | Admitting: Family

## 2023-09-25 ENCOUNTER — Encounter: Payer: Self-pay | Admitting: Family

## 2023-09-25 ENCOUNTER — Other Ambulatory Visit: Payer: Self-pay

## 2023-09-25 VITALS — BP 106/71 | HR 119 | Temp 98.9°F | Ht 64.0 in | Wt 110.0 lb

## 2023-09-25 DIAGNOSIS — L03116 Cellulitis of left lower limb: Secondary | ICD-10-CM | POA: Diagnosis not present

## 2023-09-25 NOTE — Assessment & Plan Note (Signed)
Cellulitis appears to be healing as anticipated with questionable symptoms of peeling if they are related to the linezolid or possibly a byproduct of having received vasopressors in the hospital. Will stop linezolid and add to intolerance for now with the understanding there was no anaphylaxis. Encouraged symptomatic management of symptoms as linezolid is eliminated. Follow up if symptoms worsen or do not improve over the next 48 to 72 hours.

## 2023-09-25 NOTE — Progress Notes (Signed)
Subjective:    Patient ID: Julie Rubio, female    DOB: [birth year], 18 y.o.   MRN: [id number]  Chief Complaint  Patient presents with   Acute Visit    Aching, peeling on fingers and vaginal area since being hospitalized. Noticed vaginal swelling yesterday    HPI:  Julie Rubio is a 18 y.o. female with recent hospitalization for left thigh cellulitis related to a new tattoo last seen on 09/18/23 with plan for 2 weeks of linezolid ending on 09/29/23. Interestingly urine cultures at the time grew MRSA in the absence of any urinary symptoms and no other evidence of infection. Here today for an acute visit.  Julie Rubio has been taking her linezolid as prescribed as noted some peeling in her fingers and swelling in her groin over the past couple of days. Feeling cold and with generalized body aches with no fever. Pictures of the tattoo appeared benign.   Allergies  Allergen Reactions   Linezolid     Peeling   Morphine Itching   Nsaids     Pt has one kidney      Outpatient Medications Prior to Visit  Medication Sig Dispense Refill   norethindrone (MICRONOR) 0.35 MG tablet Take 1 tablet by mouth daily.     linezolid (ZYVOX) 600 MG tablet Take 1 tablet (600 mg total) by mouth every 12 (twelve) hours for 11 days. 22 tablet 0   omeprazole (PRILOSEC) 20 MG capsule Take 20 mg by mouth daily. (Patient not taking: Reported on 09/25/2023)  3   sertraline (ZOLOFT) 25 MG tablet Take 25 mg by mouth at bedtime. (Patient not taking: Reported on 09/25/2023)     No facility-administered medications prior to visit.     Past Medical History:  Diagnosis Date   Abdominal migraine    Reflux esophagitis    Solitary kidney, congenital      Past Surgical History:  Procedure Laterality Date   ESOPHAGOGASTRODUODENOSCOPY ENDOSCOPY         Review of Systems  Constitutional:  Positive for chills. Negative for diaphoresis, fatigue and fever.  Respiratory:  Negative for cough, chest  tightness, shortness of breath and wheezing.   Cardiovascular:  Negative for chest pain.  Gastrointestinal:  Negative for abdominal pain, diarrhea, nausea and vomiting.  Skin:        Peeling of fingers      Objective:    BP 106/71   Pulse (!) 119   Temp 98.9 F (37.2 C) (Temporal)   Ht 5\' 4"  (1.626 m)   Wt 110 lb (49.9 kg)   SpO2 98%   BMI 18.88 kg/m  Nursing note and vital signs reviewed.  Physical Exam Constitutional:      General: She is not in acute distress.    Appearance: She is well-developed.  Cardiovascular:     Rate and Rhythm: Normal rate and regular rhythm.     Heart sounds: Normal heart sounds.  Pulmonary:     Effort: Pulmonary effort is normal.     Breath sounds: Normal breath sounds.  Skin:    General: Skin is warm and dry.  Neurological:     Mental Status: She is alert and oriented to person, place, and time.  Psychiatric:        Behavior: Behavior normal.        Thought Content: Thought content normal.        Judgment: Judgment normal.          No data to display  Assessment & Plan:    Patient Active Problem List   Diagnosis Date Noted   Tattoo 09/15/2023   Thrush 09/15/2023   Need for hepatitis B screening test 09/15/2023   Therapeutic drug monitoring 09/15/2023   Severe sepsis with septic shock (HCC) 09/15/2023   Sepsis due to cellulitis (HCC) 09/13/2023   Cellulitis of left thigh 09/13/2023   AKI (acute kidney injury) (HCC) 09/13/2023   Acute cystitis 09/13/2023   High anion gap metabolic acidosis 09/13/2023   Severe sepsis (HCC) 09/13/2023   Hyponatremia 09/13/2023   Fever, unspecified    Kidney congenitally absent, right 02/10/2018   Bilateral leg pain 02/09/2018   Leg weakness, bilateral 02/09/2018   Abdominal migraine 12/11/2017   Gastroesophageal reflux disease with esophagitis 12/11/2017     Problem List Items Addressed This Visit       Other   Cellulitis of left thigh - Primary   Cellulitis appears  to be healing as anticipated with questionable symptoms of peeling if they are related to the linezolid or possibly a byproduct of having received vasopressors in the hospital. Will stop linezolid and add to intolerance for now with the understanding there was no anaphylaxis. Encouraged symptomatic management of symptoms as linezolid is eliminated. Follow up if symptoms worsen or do not improve over the next 48 to 72 hours.         I have discontinued Julie Rubio's linezolid. I am also having her maintain her omeprazole, norethindrone, and sertraline.    Follow-up: If symptoms worsen or do not improve   Marcos Eke, MSN, FNP-C Nurse Practitioner [facility] Main number: [phone]

## 2023-09-25 NOTE — Patient Instructions (Signed)
Nice to see you.  Stop taking the antibiotic.   Stay out of work until Wednesday at the earliest.   Seek further care if fevers develop or symptoms progressively worsen.

## 2023-09-25 NOTE — Telephone Encounter (Signed)
Patient mother called office today requesting earlier appt. States that pt is c/o of swelling at vaginal area, skin peeing around fingers. Was seen by OBGYN who does not think pt has yeast infection.  Is scheduled to come in today for evaluation. Advised patient's mother to hold all antibiotics until seen. Verbalized understanding.  Pt has already had morning dose of antibiotics. Will hold until told otherwise.  Provider updated.  Juanita Laster, RMA

## 2023-09-28 ENCOUNTER — Inpatient Hospital Stay: Payer: 59 | Admitting: Family

## 2023-12-07 ENCOUNTER — Encounter: Payer: 59 | Attending: Psychology | Admitting: Psychology

## 2023-12-07 DIAGNOSIS — F5102 Adjustment insomnia: Secondary | ICD-10-CM | POA: Insufficient documentation

## 2023-12-07 DIAGNOSIS — F331 Major depressive disorder, recurrent, moderate: Secondary | ICD-10-CM | POA: Diagnosis not present

## 2023-12-07 DIAGNOSIS — F410 Panic disorder [episodic paroxysmal anxiety] without agoraphobia: Secondary | ICD-10-CM | POA: Insufficient documentation

## 2023-12-07 DIAGNOSIS — F411 Generalized anxiety disorder: Secondary | ICD-10-CM | POA: Insufficient documentation

## 2023-12-07 DIAGNOSIS — G43D0 Abdominal migraine, not intractable: Secondary | ICD-10-CM | POA: Diagnosis present

## 2023-12-21 ENCOUNTER — Encounter: Payer: Self-pay | Admitting: Psychology

## 2023-12-21 ENCOUNTER — Encounter: Payer: 59 | Admitting: Psychology

## 2023-12-21 DIAGNOSIS — G43D0 Abdominal migraine, not intractable: Secondary | ICD-10-CM

## 2023-12-21 DIAGNOSIS — F331 Major depressive disorder, recurrent, moderate: Secondary | ICD-10-CM | POA: Diagnosis not present

## 2023-12-21 DIAGNOSIS — F410 Panic disorder [episodic paroxysmal anxiety] without agoraphobia: Secondary | ICD-10-CM

## 2023-12-21 DIAGNOSIS — F411 Generalized anxiety disorder: Secondary | ICD-10-CM

## 2023-12-21 DIAGNOSIS — F5102 Adjustment insomnia: Secondary | ICD-10-CM

## 2023-12-21 NOTE — Progress Notes (Signed)
 Neuropsychology Visit  Patient:  Julie Rubio   DOB: Nov 19, 2005  MR Number: [id number]  Location: [facility] [place] PHYSICAL MEDICINE AND REHABILITATION [address] [place], [address] [place] [address] Dept: [phone]  Date of Service: 12/21/2023  Start: 8 AM End: 9 AM  Duration of Service: 1 Hour  Provider/Observer:     Hershal Coria PsyD  Chief Complaint:      Chief Complaint  Patient presents with   Anxiety   Depression   Panic Attack   Sleeping Problem   Migraine    Reason For Service:     Julie Rubio is a 18 year old female referred by her primary care provider for neuropsychological/psychological therapeutic interventions.  Patient has also been followed by Nita Sickle, DO with [facility] neurology due to past paresthesias with subjective leg weakness, history of migraine and more frequently abdominal migraine.  During the May 2024 event the patient was unable to stand or walk and experienced significant abdominal pain and severe cramps.  Other medical issues include the patient having a solitary kidney without kidney disease and that kidney, episodes of anemia.  Patient has a history of depressive symptomatology going back to around the eighth grade and anxiety starting in high school.  The patient has had episodes of severe depression with suicidal ideation previously and currently denies any suicidal ideation with improvement in depression but continued psychosocial stressors that can exacerbate anxiety and depressive symptomatology.   Treatment Interventions:  Cognitive/behavioral psychotherapeutic interventions  Participation Level:   Active  Participation Quality:  Appropriate      Behavioral Observation:  Well Groomed, Alert, and Appropriate.   Current Psychosocial Factors: The patient reports that she has not reconnected with her now ex-boyfriend.  This was a very challenging relationship with the  boyfriend having a lot of his own issues that created unhelpful and nonsupportive type of relationship dynamics.  The patient is comfortable with this ending of the relationship.  The patient reports that she has gotten a job recently and had her orientation yesterday and has not started the regular day-to-day work at this point.  Content of Session:   Reviewed current symptoms and continue to work on therapeutic interventions around coping and adjustment issues with a number of underlying medical issues and depression/anxiety with significant psychosocial stressors.  Effectiveness of Interventions: Report has been easy to establish and follow-up communication has been open and engaged.  Target Goals:   Working on some of the foundational issues around sleep as well as cognitive issues around expectations and interpretations of her life situation and behavioral changes primary around foundational issues.  Goals Last Reviewed:   12/21/2023  Goals Addressed Today:    Today we worked primarily on issues related to sleep hygiene as the patient is getting very little sleep and will still be awake for 5 in the morning and then stay in bed till noon.  This is a disrupted sleep pattern that is magnifying and exacerbating her underlying depression and anxiety type symptoms.  We also worked on cognitive issues about interpretation and explanation of life events and strategies to better labile events and more productive and helpful ways.  Impression/Diagnosis:   Julie Rubio is a 18 year old female referred by her primary care provider for neuropsychological/psychological therapeutic interventions.  Patient has also been followed by Nita Sickle, DO with [facility] neurology due to past paresthesias with subjective leg weakness, history of migraine and more frequently abdominal migraine.  During the May 2024 event the patient was unable to stand or walk and experienced significant abdominal pain and severe cramps.   Other medical issues include the patient having a solitary kidney without kidney disease and that kidney, episodes of anemia.  Patient has a history of depressive symptomatology going back to around the eighth grade and anxiety starting in high school.  The patient has had episodes of severe depression with suicidal ideation previously and currently denies any suicidal ideation with improvement in depression but continued psychosocial stressors that can exacerbate anxiety and depressive symptomatology.   Diagnosis:   Major depressive disorder, recurrent episode, moderate (HCC)  Generalized anxiety disorder with panic attacks  Abdominal migraine, not intractable  Adjustment insomnia    Chapman Commodore, Psy.D. Clinical Psychologist Neuropsychologist

## 2023-12-21 NOTE — Progress Notes (Signed)
 Neuropsychology Visit  Patient:  Julie Rubio   DOB: March 11, 2006  MR Number: [id number]  Location: [facility] [place] PHYSICAL MEDICINE AND REHABILITATION [address] [place], [address] [place] [address] Dept: [phone]  Date of Service: 12/07/2023  Start: 8 AM End: 9 AM  Duration of Service: 1 Hour  Provider/Observer:     Hershal Coria PsyD  Chief Complaint:      Chief Complaint  Patient presents with   Anxiety   Depression   Panic Attack   Migraine   Sleeping Problem    Reason For Service:     Julie Rubio is a 18 year old female referred by her primary care provider for neuropsychological/psychological therapeutic interventions.  Patient has also been followed by Nita Sickle, DO with [facility] neurology due to past paresthesias with subjective leg weakness, history of migraine and more frequently abdominal migraine.  During the May 2024 event the patient was unable to stand or walk and experienced significant abdominal pain and severe cramps.  Other medical issues include the patient having a solitary kidney without kidney disease and that kidney, episodes of anemia.  Patient has a history of depressive symptomatology going back to around the eighth grade and anxiety starting in high school.  The patient has had episodes of severe depression with suicidal ideation previously and currently denies any suicidal ideation with improvement in depression but continued psychosocial stressors that can exacerbate anxiety and depressive symptomatology.   Treatment Interventions:  Cognitive/behavioral psychotherapeutic interventions  Participation Level:   Active  Participation Quality:  Appropriate      Behavioral Observation:  Well Groomed, Alert, and Appropriate.   Current Psychosocial Factors: The patient has a number of psychosocial stressors including loss of previous job due to medical illness, the situations with  loss of her father and stressors about how and when to return to school.  Content of Session:   Reviewed current symptoms and continue to work on therapeutic interventions around coping and adjustment issues with a number of underlying medical issues and depression/anxiety with significant psychosocial stressors.  Effectiveness of Interventions: Report has been easy to establish and follow-up communication has been open and engaged.  Target Goals:   Working on some of the foundational issues around sleep as well as cognitive issues around expectations and interpretations of her life situation and behavioral changes primary around foundational issues.  Goals Last Reviewed:   12/07/2023  Goals Addressed Today:    Today we continue to work on coping skills and adjustments particularly around sleep hygiene issues and behavioral changes as well as problem solving and coping skills.  Impression/Diagnosis:   Julie Rubio is a 18 year old female referred by her primary care provider for neuropsychological/psychological therapeutic interventions.  Patient has also been followed by Nita Sickle, DO with [facility] neurology due to past paresthesias with subjective leg weakness, history of migraine and more frequently abdominal migraine.  During the May 2024 event the patient was unable to stand or walk and experienced significant abdominal pain and severe cramps.  Other medical issues include the patient having a solitary kidney without kidney disease and that kidney, episodes of anemia.  Patient has a history of depressive symptomatology going back to around the eighth grade and anxiety starting in high school.  The patient has had episodes of severe depression with suicidal ideation previously and currently denies any suicidal ideation with improvement in depression but continued psychosocial stressors that can exacerbate anxiety and depressive symptomatology.  Diagnosis:   Major depressive disorder,  recurrent episode, moderate (HCC)  Generalized anxiety disorder with panic attacks  Abdominal migraine, not intractable  Adjustment insomnia    Chapman Commodore, Psy.D. Clinical Psychologist Neuropsychologist

## 2024-01-04 ENCOUNTER — Encounter: Payer: Self-pay | Admitting: Psychology

## 2024-01-04 ENCOUNTER — Encounter: Payer: 59 | Attending: Psychology | Admitting: Psychology

## 2024-01-04 DIAGNOSIS — G43D0 Abdominal migraine, not intractable: Secondary | ICD-10-CM

## 2024-01-04 DIAGNOSIS — F411 Generalized anxiety disorder: Secondary | ICD-10-CM | POA: Diagnosis not present

## 2024-01-04 DIAGNOSIS — F5102 Adjustment insomnia: Secondary | ICD-10-CM | POA: Diagnosis present

## 2024-01-04 DIAGNOSIS — F410 Panic disorder [episodic paroxysmal anxiety] without agoraphobia: Secondary | ICD-10-CM

## 2024-01-04 DIAGNOSIS — F331 Major depressive disorder, recurrent, moderate: Secondary | ICD-10-CM | POA: Diagnosis present

## 2024-01-04 NOTE — Progress Notes (Signed)
 Neuropsychology Visit  Patient:  Julie Rubio   DOB: 2006/06/26  MR Number: [id number]  Location: [facility] [place] [facility] [address] [place], [address] [place] [address] Dept: [phone]  Date of Service: 01/04/2024  Start: 9 AM End: 10 AM  Duration of Service: 1 Hour  Provider/Observer:     Marrion Sjogren PsyD  Chief Complaint:      Chief Complaint  Patient presents with   Anxiety   Depression   Panic Attack   Sleeping Problem   Migraine    Reason For Service:     Julie Rubio is a 18 year old female referred by her primary care provider for neuropsychological/psychological therapeutic interventions.  Patient has also been followed by Reyna Cava, DO with [facility] neurology due to past paresthesias with subjective leg weakness, history of migraine and more frequently abdominal migraine.  During the May 2024 event the patient was unable to stand or walk and experienced significant abdominal pain and severe cramps.  Other medical issues include the patient having a solitary kidney without kidney disease and that kidney, episodes of anemia.  Patient has a history of depressive symptomatology going back to around the eighth grade and anxiety starting in high school.  The patient has had episodes of severe depression with suicidal ideation previously and currently denies any suicidal ideation with improvement in depression but continued psychosocial stressors that can exacerbate anxiety and depressive symptomatology.   Treatment Interventions:  Cognitive/behavioral psychotherapeutic interventions  Participation Level:   Active  Participation Quality:  Appropriate      Behavioral Observation:  Well Groomed, Alert, and Appropriate.   Current Psychosocial Factors: The patient reports that she is now getting ready to start her job after training and today will be her first day.  Patient  notes that she is looking forward to working but does have some apprehension.  The patient reports that her ex-boyfriend has made attempts to communicate with her indirectly through social media but she has not engaged in discussions with him.  Content of Session:   Reviewed current symptoms and continue to work on therapeutic interventions around coping and adjustment issues with a number of underlying medical issues and depression/anxiety with significant psychosocial stressors.  Effectiveness of Interventions: Report has been easy to establish and follow-up communication has been open and engaged.  Target Goals:   Working on some of the foundational issues around sleep as well as cognitive issues around expectations and interpretations of her life situation and behavioral changes primary around foundational issues.  Goals Last Reviewed:   01/04/2024  Goals Addressed Today:    Today we worked primarily on issues related to sleep hygiene as the patient is getting very little sleep and will still be awake for 5 in the morning and then stay in bed till noon.  This is a disrupted sleep pattern that is magnifying and exacerbating her underlying depression and anxiety type symptoms.  We also worked on cognitive issues about interpretation and explanation of life events and strategies to better labile events and more productive and helpful ways.  Impression/Diagnosis:   Julie Rubio is a 18 year old female referred by her primary care provider for neuropsychological/psychological therapeutic interventions.  Patient has also been followed by Reyna Cava, DO with [facility] neurology due to past paresthesias with subjective leg weakness, history of migraine and more frequently abdominal migraine.  During the May 2024 event the patient was unable to stand or  walk and experienced significant abdominal pain and severe cramps.  Other medical issues include the patient having a solitary kidney without kidney  disease and that kidney, episodes of anemia.  Patient has a history of depressive symptomatology going back to around the eighth grade and anxiety starting in high school.  The patient has had episodes of severe depression with suicidal ideation previously and currently denies any suicidal ideation with improvement in depression but continued psychosocial stressors that can exacerbate anxiety and depressive symptomatology.   Diagnosis:   Major depressive disorder, recurrent episode, moderate (HCC)  Generalized anxiety disorder with panic attacks  Abdominal migraine, not intractable  Adjustment insomnia    Chapman Commodore, Psy.D. Clinical Psychologist Neuropsychologist

## 2024-01-18 ENCOUNTER — Encounter: Payer: 59 | Admitting: Psychology

## 2024-01-18 DIAGNOSIS — F411 Generalized anxiety disorder: Secondary | ICD-10-CM

## 2024-01-18 DIAGNOSIS — F331 Major depressive disorder, recurrent, moderate: Secondary | ICD-10-CM | POA: Diagnosis not present

## 2024-01-18 DIAGNOSIS — F5102 Adjustment insomnia: Secondary | ICD-10-CM

## 2024-01-18 DIAGNOSIS — G43D0 Abdominal migraine, not intractable: Secondary | ICD-10-CM

## 2024-01-18 DIAGNOSIS — F410 Panic disorder [episodic paroxysmal anxiety] without agoraphobia: Secondary | ICD-10-CM

## 2024-02-08 ENCOUNTER — Encounter: Payer: Self-pay | Admitting: Psychology

## 2024-02-08 NOTE — Progress Notes (Signed)
 Neuropsychology Visit  Patient:  Julie Rubio   DOB: Apr 20, 2006  MR Number: [id number]  Location: [facility] [place] [facility] [address] [place], [address] [place] [address] Dept: [phone]  Date of Service: 01/18/2024  Start: 8 AM End: 9 AM  Duration of Service: 1 Hour  Provider/Observer:     Marrion Sjogren PsyD  Chief Complaint:      Chief Complaint  Patient presents with   Anxiety   Depression   Panic Attack   Sleeping Problem   Migraine    Reason For Service:     Julie Rubio is a 18 year old female referred by her primary care provider for neuropsychological/psychological therapeutic interventions.  Patient has also been followed by Julie Cava, DO with [facility] neurology due to past paresthesias with subjective leg weakness, history of migraine and more frequently abdominal migraine.  During the May 2024 event the patient was unable to stand or walk and experienced significant abdominal pain and severe cramps.  Other medical issues include the patient having a solitary kidney without kidney disease and that kidney, episodes of anemia.  Patient has a history of depressive symptomatology going back to around the eighth grade and anxiety starting in high school.  The patient has had episodes of severe depression with suicidal ideation previously and currently denies any suicidal ideation with improvement in depression but continued psychosocial stressors that can exacerbate anxiety and depressive symptomatology.   Treatment Interventions:  Cognitive/behavioral psychotherapeutic interventions  Participation Level:   Active  Participation Quality:  Appropriate      Behavioral Observation:  Well Groomed, Alert, and Appropriate.   Current Psychosocial Factors: The patient reports that she is now getting ready to start her job after training and today will be her first day.  Patient  notes that she is looking forward to working but does have some apprehension.  The patient reports that her ex-boyfriend has made attempts to communicate with her indirectly through social media but she has not engaged in discussions with him.  Content of Session:   Reviewed current symptoms and continue to work on therapeutic interventions around coping and adjustment issues with a number of underlying medical issues and depression/anxiety with significant psychosocial stressors.  Effectiveness of Interventions: Report has been easy to establish and follow-up communication has been open and engaged.  Target Goals:   Working on some of the foundational issues around sleep as well as cognitive issues around expectations and interpretations of her life situation and behavioral changes primary around foundational issues.  Goals Last Reviewed:   01/18/2024  Goals Addressed Today:    Today we worked primarily on issues related to sleep hygiene as the patient is getting very little sleep and will still be awake for 5 in the morning and then stay in bed till noon.  This is a disrupted sleep pattern that is magnifying and exacerbating her underlying depression and anxiety type symptoms.  We also worked on cognitive issues about interpretation and explanation of life events and strategies to better labile events and more productive and helpful ways.  Impression/Diagnosis:   Julie Rubio is a 18 year old female referred by her primary care provider for neuropsychological/psychological therapeutic interventions.  Patient has also been followed by Julie Cava, DO with [facility] neurology due to past paresthesias with subjective leg weakness, history of migraine and more frequently abdominal migraine.  During the May 2024 event the patient was unable to stand or  walk and experienced significant abdominal pain and severe cramps.  Other medical issues include the patient having a solitary kidney without kidney  disease and that kidney, episodes of anemia.  Patient has a history of depressive symptomatology going back to around the eighth grade and anxiety starting in high school.  The patient has had episodes of severe depression with suicidal ideation previously and currently denies any suicidal ideation with improvement in depression but continued psychosocial stressors that can exacerbate anxiety and depressive symptomatology.   Diagnosis:   Major depressive disorder, recurrent episode, moderate (HCC)  Generalized anxiety disorder with panic attacks  Abdominal migraine, not intractable  Adjustment insomnia    Julie Rubio, Psy.D. Clinical Psychologist Neuropsychologist

## 2024-07-03 ENCOUNTER — Encounter: Admitting: Psychology

## 2024-07-23 ENCOUNTER — Encounter: Payer: Self-pay | Attending: Psychology | Admitting: Psychology

## 2024-08-15 ENCOUNTER — Other Ambulatory Visit: Payer: Self-pay

## 2024-08-15 ENCOUNTER — Encounter (HOSPITAL_COMMUNITY): Payer: Self-pay | Admitting: Obstetrics and Gynecology

## 2024-08-15 ENCOUNTER — Inpatient Hospital Stay (HOSPITAL_COMMUNITY): Payer: Self-pay

## 2024-08-15 ENCOUNTER — Inpatient Hospital Stay (HOSPITAL_COMMUNITY)
Admission: AD | Admit: 2024-08-15 | Discharge: 2024-08-15 | Disposition: A | Discharge status: Home / Self Care | Payer: Self-pay | Attending: Obstetrics and Gynecology | Admitting: Obstetrics and Gynecology

## 2024-08-15 DIAGNOSIS — O26891 Other specified pregnancy related conditions, first trimester: Secondary | ICD-10-CM | POA: Diagnosis present

## 2024-08-15 DIAGNOSIS — R109 Unspecified abdominal pain: Secondary | ICD-10-CM

## 2024-08-15 DIAGNOSIS — O208 Other hemorrhage in early pregnancy: Secondary | ICD-10-CM | POA: Diagnosis not present

## 2024-08-15 DIAGNOSIS — O26899 Other specified pregnancy related conditions, unspecified trimester: Secondary | ICD-10-CM

## 2024-08-15 DIAGNOSIS — Z3491 Encounter for supervision of normal pregnancy, unspecified, first trimester: Secondary | ICD-10-CM

## 2024-08-15 DIAGNOSIS — Z3A01 Less than 8 weeks gestation of pregnancy: Secondary | ICD-10-CM

## 2024-08-15 LAB — URINALYSIS, ROUTINE W REFLEX MICROSCOPIC
Bacteria, UA: NONE SEEN
Bilirubin Urine: NEGATIVE
Glucose, UA: NEGATIVE mg/dL
Hgb urine dipstick: NEGATIVE
Ketones, ur: 20 mg/dL — AB
Leukocytes,Ua: NEGATIVE
Nitrite: NEGATIVE
Protein, ur: 30 mg/dL — AB
Specific Gravity, Urine: 1.025 (ref 1.005–1.030)
pH: 7 (ref 5.0–8.0)

## 2024-08-15 LAB — CBC
HCT: 34.9 % — ABNORMAL LOW (ref 36.0–46.0)
Hemoglobin: 11.4 g/dL — ABNORMAL LOW (ref 12.0–15.0)
MCH: 26.8 pg (ref 26.0–34.0)
MCHC: 32.7 g/dL (ref 30.0–36.0)
MCV: 82.1 fL (ref 80.0–100.0)
Platelets: 508 K/uL — ABNORMAL HIGH (ref 150–400)
RBC: 4.25 MIL/uL (ref 3.87–5.11)
RDW: 15.8 % — ABNORMAL HIGH (ref 11.5–15.5)
WBC: 12.6 K/uL — ABNORMAL HIGH (ref 4.0–10.5)
nRBC: 0 % (ref 0.0–0.2)

## 2024-08-15 LAB — WET PREP, GENITAL
Clue Cells Wet Prep HPF POC: NONE SEEN
Sperm: NONE SEEN
Trich, Wet Prep: NONE SEEN
WBC, Wet Prep HPF POC: >=10 — AB (ref <10)
Yeast Wet Prep HPF POC: NONE SEEN

## 2024-08-15 LAB — HCG, QUANTITATIVE, PREGNANCY: hCG, Beta Chain, Quant, S: 122056 m[IU]/mL — ABNORMAL HIGH (ref <5)

## 2024-08-15 LAB — POCT PREGNANCY, URINE: Preg Test, Ur: POSITIVE — AB

## 2024-08-15 NOTE — Discharge Instructions (Signed)
Return to care  If you have heavier bleeding that soaks through more than 2 pads per hour for an hour or more If you bleed so much that you feel like you might pass out or you do pass out If you have significant abdominal pain that is not improved with Tylenol      Safe Medications in Pregnancy   Acne: Benzoyl Peroxide Salicylic Acid  Backache/Headache: Tylenol: 2 regular strength every 4 hours OR              2 Extra strength every 6 hours  Colds/Coughs/Allergies: Benadryl (alcohol free) 25 mg every 6 hours as needed Breath right strips Claritin Cepacol throat lozenges Chloraseptic throat spray Cold-Eeze- up to three times per day Cough drops, alcohol free Flonase (by prescription only) Guaifenesin Mucinex Robitussin DM (plain only, alcohol free) Saline nasal spray/drops Sudafed (pseudoephedrine) & Actifed ** use only after [redacted] weeks gestation and if you do not have high blood pressure Tylenol Vicks Vaporub Zinc lozenges Zyrtec   Constipation: Colace Ducolax suppositories Fleet enema Glycerin suppositories Metamucil Milk of magnesia Miralax Senokot Smooth move tea  Diarrhea: Kaopectate Imodium A-D  *NO pepto Bismol  Hemorrhoids: Anusol Anusol HC Preparation H Tucks  Indigestion: Tums Maalox Mylanta Zantac  Pepcid  Insomnia: Benadryl (alcohol free) 25mg every 6 hours as needed Tylenol PM Unisom, no Gelcaps  Leg Cramps: Tums MagGel  Nausea/Vomiting:  Bonine Dramamine Emetrol Ginger extract Sea bands Meclizine  Nausea medication to take during pregnancy:  Unisom (doxylamine succinate 25 mg tablets) Take one tablet daily at bedtime. If symptoms are not adequately controlled, the dose can be increased to a maximum recommended dose of two tablets daily (1/2 tablet in the morning, 1/2 tablet mid-afternoon and one at bedtime). Vitamin B6 100mg tablets. Take one tablet twice a day (up to 200 mg per day).  Skin Rashes: Aveeno  products Benadryl cream or 25mg every 6 hours as needed Calamine Lotion 1% cortisone cream  Yeast infection: Gyne-lotrimin 7 Monistat 7  Gum/tooth pain: Anbesol  **If taking multiple medications, please check labels to avoid duplicating the same active ingredients **take medication as directed on the label ** Do not exceed 4000 mg of tylenol in 24 hours **Do not take medications that contain aspirin or ibuprofen    

## 2024-08-15 NOTE — MAU Provider Note (Signed)
 History     CSN: [id number]  Arrival date and time: 08/15/24 1528   Event Date/Time   First Provider Initiated Contact with Patient 08/15/24 1723      Chief Complaint  Patient presents with   Abdominal Pain   HPI Julie Rubio is a 18 y.o. G1P0 at [redacted]w[redacted]d who presents with abdominal pain. Reports intermittent abdominal cramping for the last 2 weeks. Was mainly LLQ but for 3 days has had some episodes in RLQ. Has had some n/v. Denies diarrhea, constipation, dysuria, vaginal bleeding, or vaginal discharge.   OB History     Gravida  1   Para      Term      Preterm      AB      Living         SAB      IAB      Ectopic      Multiple      Live Births              Past Medical History:  Diagnosis Date   Abdominal migraine    Reflux esophagitis    Solitary kidney, congenital     Past Surgical History:  Procedure Laterality Date   ESOPHAGOGASTRODUODENOSCOPY ENDOSCOPY      Family History  Problem Relation Age of Onset   Raynaud syndrome Mother     Social History[1]  Allergies: Allergies[2]  Medications Prior to Admission  Medication Sig Dispense Refill Last Dose/Taking   norethindrone (MICRONOR) 0.35 MG tablet Take 1 tablet by mouth daily.      omeprazole (PRILOSEC) 20 MG capsule Take 20 mg by mouth daily. (Patient not taking: Reported on 09/25/2023)  3    sertraline (ZOLOFT) 25 MG tablet Take 25 mg by mouth at bedtime. (Patient not taking: Reported on 09/25/2023)       Review of Systems  All other systems reviewed and are negative.  Physical Exam   Blood pressure 105/73, pulse 74, temperature 97.8 F (36.6 C), temperature source Oral, resp. rate 18, height 5' 4 (1.626 m), weight 49 kg, last menstrual period 06/30/2024, SpO2 100%.  Physical Exam Vitals and nursing note reviewed.  Constitutional:      General: She is not in acute distress.    Appearance: She is well-developed. She is not ill-appearing.  HENT:     Head: Normocephalic and  atraumatic.  Eyes:     General: No scleral icterus.       Right eye: No discharge.        Left eye: No discharge.     Conjunctiva/sclera: Conjunctivae normal.  Pulmonary:     Effort: Pulmonary effort is normal. No respiratory distress.  Neurological:     General: No focal deficit present.     Mental Status: She is alert.  Psychiatric:        Mood and Affect: Mood normal.        Behavior: Behavior normal.     MAU Course  Procedures Results for orders placed or performed during the hospital encounter of 08/15/24 (from the past 24 hours)  Pregnancy, urine POC     Status: Abnormal   Collection Time: 08/15/24  4:42 PM  Result Value Ref Range   Preg Test, Ur POSITIVE (A) NEGATIVE  Wet prep, genital     Status: Abnormal   Collection Time: 08/15/24  5:11 PM   Specimen: PATH Cytology Cervicovaginal Ancillary Only  Result Value Ref Range   Yeast Wet Prep HPF POC  NONE SEEN NONE SEEN   Trich, Wet Prep NONE SEEN NONE SEEN   Clue Cells Wet Prep HPF POC NONE SEEN NONE SEEN   WBC, Wet Prep HPF POC >=10 (A) <10   Sperm NONE SEEN   CBC     Status: Abnormal   Collection Time: 08/15/24  5:32 PM  Result Value Ref Range   WBC 12.6 (H) 4.0 - 10.5 K/uL   RBC 4.25 3.87 - 5.11 MIL/uL   Hemoglobin 11.4 (L) 12.0 - 15.0 g/dL   HCT 65.0 (L) 63.9 - 53.9 %   MCV 82.1 80.0 - 100.0 fL   MCH 26.8 26.0 - 34.0 pg   MCHC 32.7 30.0 - 36.0 g/dL   RDW 84.1 (H) 88.4 - 84.4 %   Platelets 508 (H) 150 - 400 K/uL   nRBC 0.0 0.0 - 0.2 %  ABO/Rh     Status: None   Collection Time: 08/15/24  5:32 PM  Result Value Ref Range   ABO/RH(D)      A POS Performed at [facility], [address] [place], [place] [address]   hCG, quantitative, pregnancy     Status: Abnormal   Collection Time: 08/15/24  5:32 PM  Result Value Ref Range   hCG, Beta Chain, Quant, S 122,056 (H) <5 mIU/mL  Urinalysis, Routine w reflex microscopic -Urine, Clean Catch     Status: Abnormal   Collection Time: 08/15/24  6:24 PM  Result  Value Ref Range   Color, Urine YELLOW YELLOW   APPearance CLEAR CLEAR   Specific Gravity, Urine 1.025 1.005 - 1.030   pH 7.0 5.0 - 8.0   Glucose, UA NEGATIVE NEGATIVE mg/dL   Hgb urine dipstick NEGATIVE NEGATIVE   Bilirubin Urine NEGATIVE NEGATIVE   Ketones, ur 20 (A) NEGATIVE mg/dL   Protein, ur 30 (A) NEGATIVE mg/dL   Nitrite NEGATIVE NEGATIVE   Leukocytes,Ua NEGATIVE NEGATIVE   RBC / HPF 0-5 0 - 5 RBC/hpf   WBC, UA 0-5 0 - 5 WBC/hpf   Bacteria, UA NONE SEEN NONE SEEN   Squamous Epithelial / HPF 0-5 0 - 5 /HPF   Mucus PRESENT    US  OB Comp Less 14 Wks Result Date: 08/15/2024 CLINICAL DATA:  Abdomen pain EXAM: OBSTETRIC <14 WK ULTRASOUND TECHNIQUE: Transabdominal ultrasound was performed for evaluation of the gestation as well as the maternal uterus and adnexal regions. COMPARISON:  None Available. FINDINGS: Intrauterine gestational sac: Single Yolk sac:  Visualized. Embryo:  Visualized. Cardiac Activity: Visualized. Heart Rate: 129 bpm CRL: 6.9  mm   6 w 4 d                  US  EDC: 04/06/2025 Subchorionic hemorrhage:  Small Maternal uterus/adnexae: Probable corpus luteum right ovary. Otherwise negative. IMPRESSION: 1.  Single live intrauterine gestation as above. 2.  Small subchorionic hemorrhage Electronically Signed   By: Luke Bun M.D.   On: 08/15/2024 18:07    MDM   Assessment and Plan   1. Abdominal pain affecting pregnancy   2. Normal IUP (intrauterine pregnancy) on prenatal ultrasound, first trimester   3. [redacted] weeks gestation of pregnancy    -Ultrasound shows live IUP measuring [redacted]w[redacted]d, c/w LMP. Small subchorionic hemorrhage. Reviewed results with patient & discussed bleeding precautions. Has f/u with [facility] ob for prenatal care.  -Wet prep & u/a negative -GC/CT pending -Reviewed reasons to return to MAU  Rocky Satterfield 08/15/2024, 5:23 PM      [1]  Social  History Tobacco Use   Smoking status: Never   Smokeless tobacco: Never  Vaping Use   Vaping status:  Never Used  Substance Use Topics   Alcohol use: Never   Drug use: Never  [2]  Allergies Allergen Reactions   Linezolid      Peeling   Morphine  Itching   Nsaids     Pt has one kidney

## 2024-08-15 NOTE — MAU Note (Signed)
 Julie Rubio is a 18 y.o. at Unknown here in MAU reporting: she began having lower abdominal pain that began 2 weeks ago.  Denies VB.  States has had 2 +HPT.  LMP: 06/30/2024 Onset of complaint: 2 weeks ago Pain score: 7 Vitals:   08/15/24 1616  BP: 105/73  Pulse: 74  Resp: 18  Temp: 97.8 F (36.6 C)  SpO2: 100%     FHT: NA  Lab orders placed from triage: UPT

## 2024-08-16 LAB — ABO/RH: ABO/RH(D): A POS

## 2024-08-16 LAB — GC/CHLAMYDIA PROBE AMP (~~LOC~~) NOT AT ARMC
Chlamydia: NEGATIVE
Comment: NEGATIVE
Comment: NORMAL
Neisseria Gonorrhea: NEGATIVE

## 2024-08-20 ENCOUNTER — Encounter: Payer: Self-pay | Attending: Psychology | Admitting: Psychology

## 2024-09-18 ENCOUNTER — Encounter: Payer: Self-pay | Admitting: Psychology

## 2024-10-17 ENCOUNTER — Telehealth: Payer: Self-pay

## 2024-10-28 ENCOUNTER — Encounter: Payer: Self-pay | Admitting: Obstetrics and Gynecology
